# Patient Record
Sex: Male | Born: 1980
Health system: Southern US, Community
[De-identification: ages and names within clinical notes are randomized; demographics above are authoritative.]

## PROBLEM LIST (undated history)

## (undated) DIAGNOSIS — IMO0001 Reserved for inherently not codable concepts without codable children: Secondary | ICD-10-CM

## (undated) DIAGNOSIS — F17200 Nicotine dependence, unspecified, uncomplicated: Secondary | ICD-10-CM

## (undated) DIAGNOSIS — J45909 Unspecified asthma, uncomplicated: Secondary | ICD-10-CM

## (undated) HISTORY — PX: CYST REMOVAL NECK: SHX6281

---

## 2001-09-12 ENCOUNTER — Emergency Department (HOSPITAL_COMMUNITY): Admission: EM | Admit: 2001-09-12 | Discharge: 2001-09-12 | Payer: Self-pay | Admitting: Emergency Medicine

## 2001-09-24 ENCOUNTER — Emergency Department (HOSPITAL_COMMUNITY): Admission: EM | Admit: 2001-09-24 | Discharge: 2001-09-24 | Payer: Self-pay | Admitting: Emergency Medicine

## 2002-03-03 ENCOUNTER — Emergency Department (HOSPITAL_COMMUNITY): Admission: EM | Admit: 2002-03-03 | Discharge: 2002-03-03 | Payer: Self-pay | Admitting: Emergency Medicine

## 2004-12-28 ENCOUNTER — Emergency Department (HOSPITAL_COMMUNITY): Admission: EM | Admit: 2004-12-28 | Discharge: 2004-12-28 | Payer: Self-pay | Admitting: Emergency Medicine

## 2004-12-30 ENCOUNTER — Emergency Department (HOSPITAL_COMMUNITY): Admission: EM | Admit: 2004-12-30 | Discharge: 2004-12-30 | Payer: Self-pay | Admitting: *Deleted

## 2006-11-24 IMAGING — CR DG HAND COMPLETE 3+V*L*
3 series · 3 of 3 positions shown · non-contrast
Comparison: none

CLINICAL DATA: cut finger
LEFT HAND 3 VIEWS:
There is soft tissue swelling surrounding the proximal phalanx of the left fourth finger.  There are no fractures or foreign bodies and there are no destructive changes.

[view not recorded (1 of 3)]
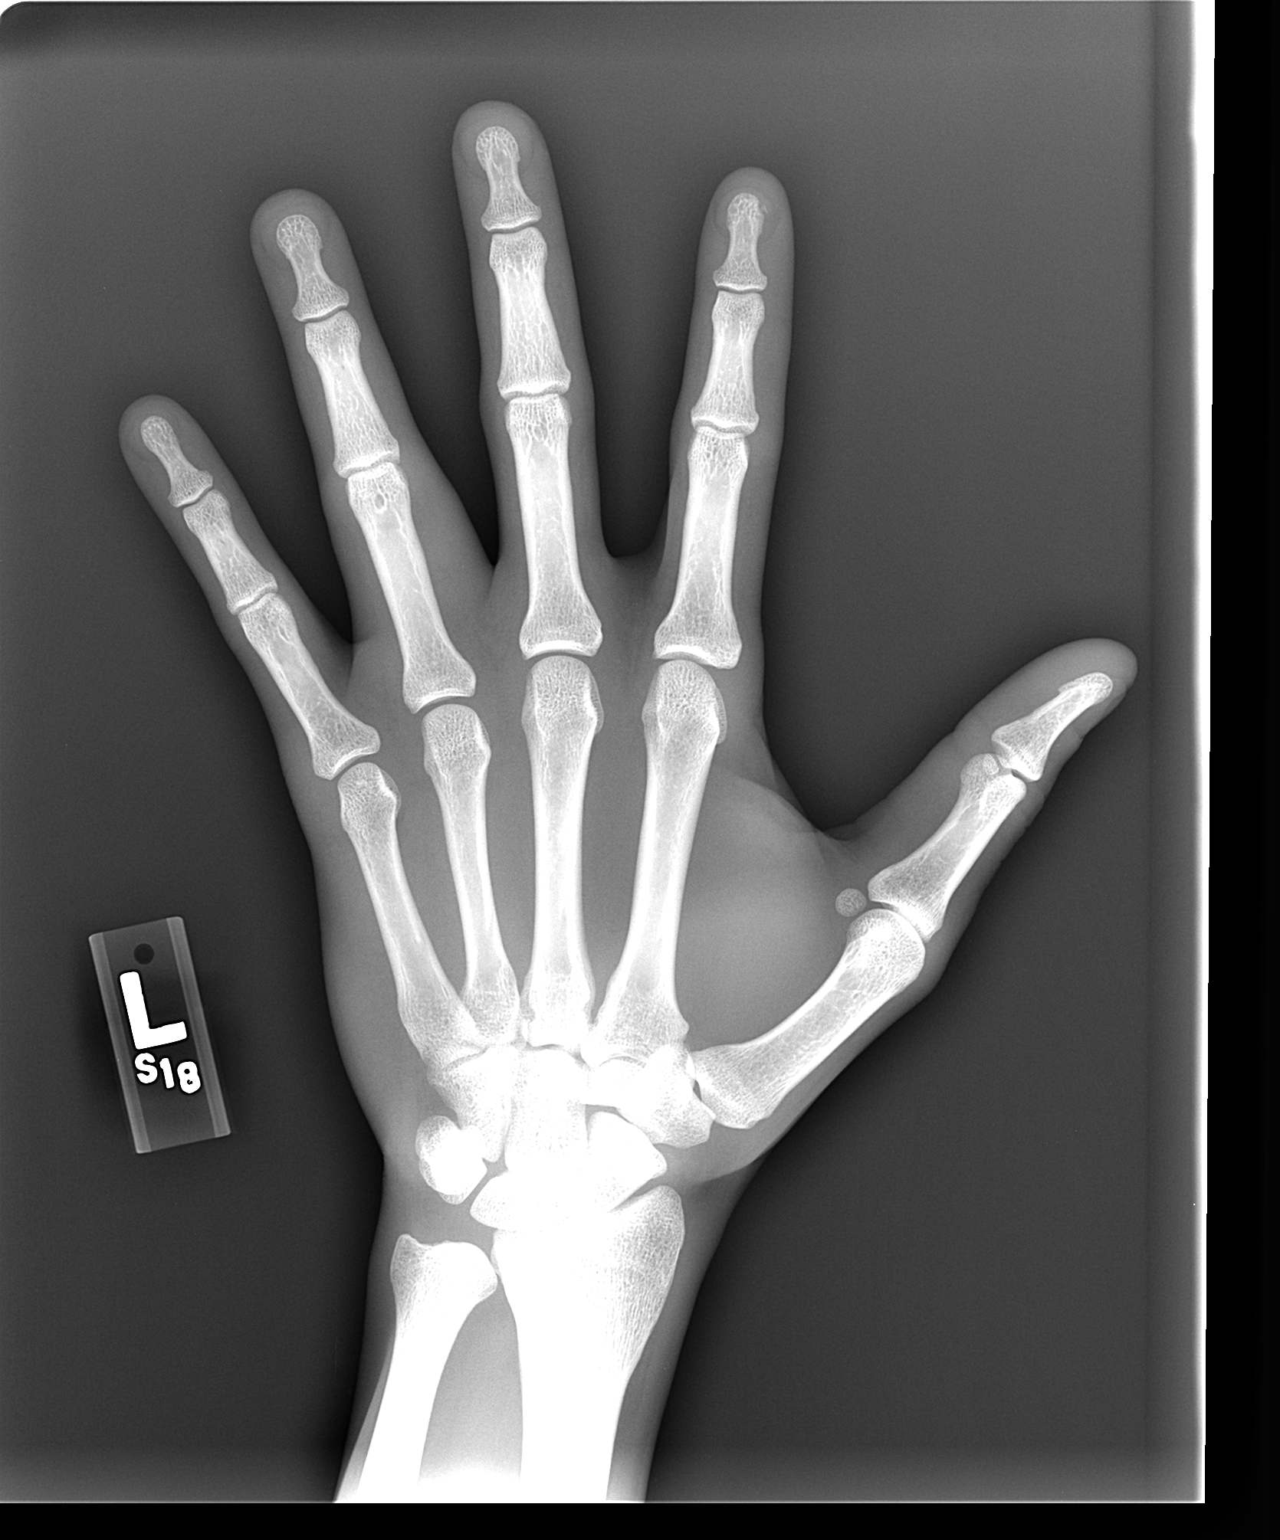

[view not recorded (2 of 3)]
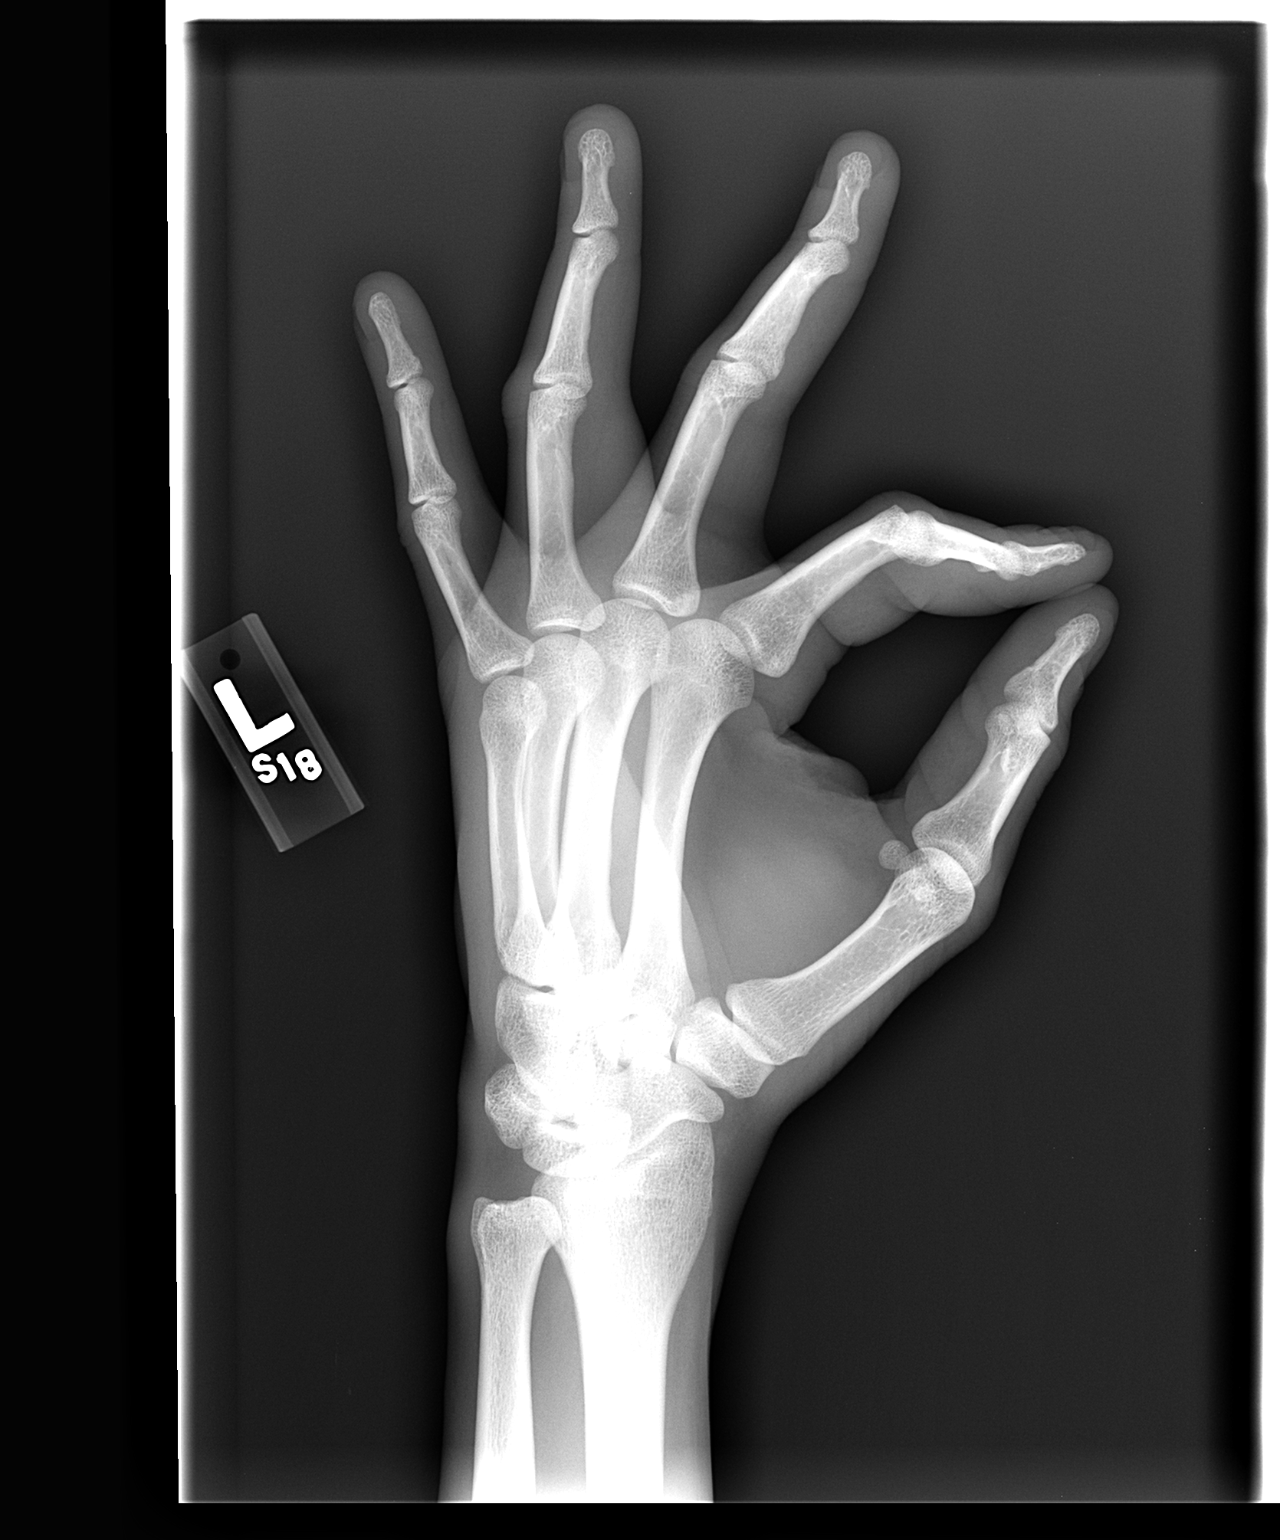

[view not recorded (3 of 3)]
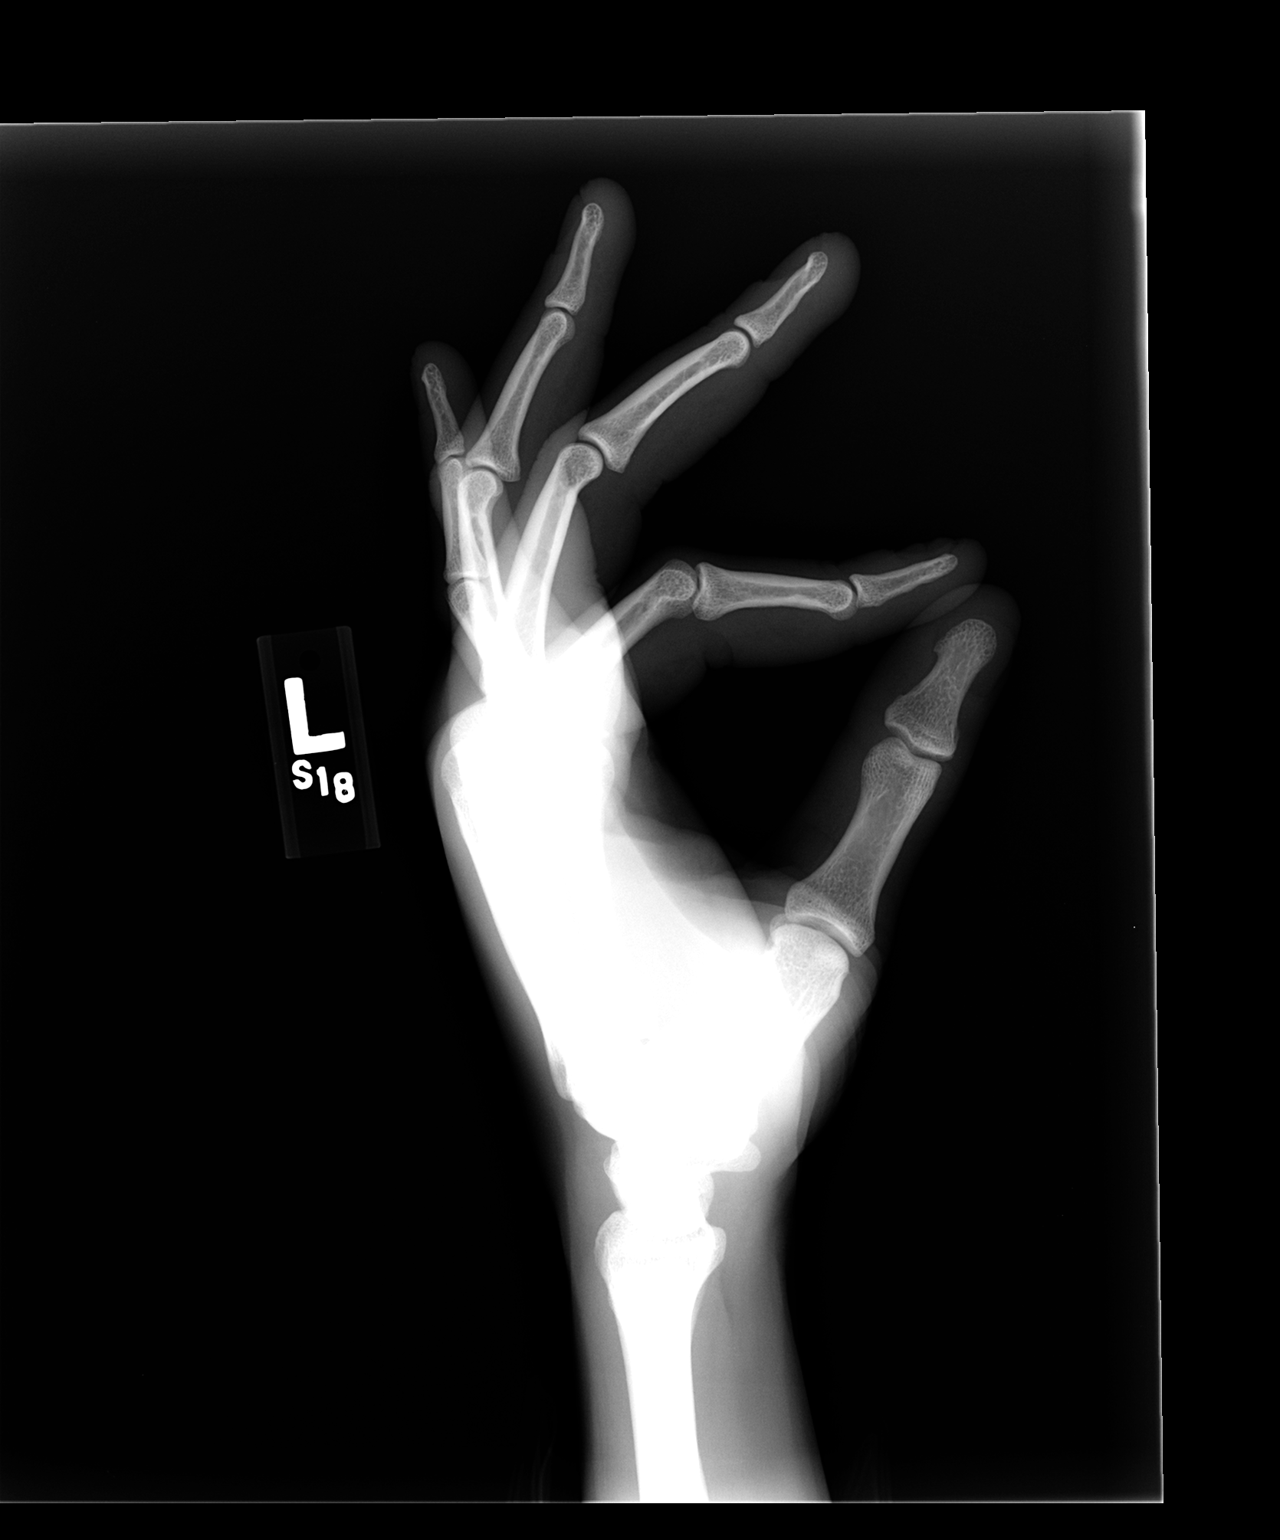

[3 of 3 positions shown; findings below may reference images not displayed]

IMPRESSION: Soft tissue swelling associated with the proximal phalanx of the left fourth finger.  Otherwise normal study.

## 2011-12-17 ENCOUNTER — Emergency Department (INDEPENDENT_AMBULATORY_CARE_PROVIDER_SITE_OTHER)
Admission: EM | Admit: 2011-12-17 | Discharge: 2011-12-17 | Disposition: A | Discharge status: Home / Self Care | Payer: Self-pay | Source: Home / Self Care | Attending: Family Medicine | Admitting: Family Medicine

## 2011-12-17 ENCOUNTER — Encounter (HOSPITAL_COMMUNITY): Payer: Self-pay | Admitting: Emergency Medicine

## 2011-12-17 DIAGNOSIS — J40 Bronchitis, not specified as acute or chronic: Secondary | ICD-10-CM

## 2011-12-17 MED ORDER — PREDNISONE 20 MG PO TABS: ORAL_TABLET | ORAL | Status: AC

## 2011-12-17 MED ORDER — CETIRIZINE-PSEUDOEPHEDRINE ER 5-120 MG PO TB12: 1.0000 | ORAL_TABLET | Freq: Two times a day (BID) | ORAL | Status: AC

## 2011-12-17 MED ORDER — ALBUTEROL SULFATE HFA 108 (90 BASE) MCG/ACT IN AERS: 1.0000 | INHALATION_SPRAY | Freq: Four times a day (QID) | RESPIRATORY_TRACT | Status: AC | PRN

## 2011-12-17 MED ORDER — DEXTROMETHORPHAN-GUAIFENESIN 15-400 MG PO TABS: 1.0000 | ORAL_TABLET | Freq: Three times a day (TID) | ORAL | Status: DC | PRN

## 2011-12-17 MED ORDER — AZITHROMYCIN 250 MG PO TABS: 250.0000 mg | ORAL_TABLET | Freq: Every day | ORAL | Status: AC

## 2011-12-17 MED ORDER — IBUPROFEN 600 MG PO TABS: 600.0000 mg | ORAL_TABLET | Freq: Three times a day (TID) | ORAL | Status: AC

## 2011-12-17 NOTE — ED Notes (Signed)
Cough for 2 weeks.  Intermittently has had green phlegm.  At one point patient thought symptoms were improving, but feels symptoms are worsening.  Denies fever.  Patient stresses the cough is terrible, coughing so bad at times, results in vomiting

## 2011-12-17 NOTE — ED Notes (Signed)
Instructed to remove shirts and place gown on for provider examinaltion

## 2011-12-17 NOTE — Discharge Instructions (Signed)
You need to quit smoking! Is very important top keep well hydrated. Take the prescribed medications as instructed. Take ibuprofen scheduled every 8 hours for the next 24-48 hours take with food and plenty of liquids as it can upset your stomach, can take over the counter prilosec while taking ibuprofen. Use nasal saline spray at least 3 times a day. (simply saline is over the counter) Return if difficulty breathing or not keeping fluids down. Despite following treatment

## 2011-12-19 NOTE — ED Provider Notes (Signed)
History     CSN: [id number]  Arrival date & time 12/17/11  1621   First MD Initiated Contact with Patient 12/17/11 1704      Chief Complaint  Patient presents with  . Cough    (Consider location/radiation/quality/duration/timing/severity/associated sxs/prior treatment) HPI Comments: 31 y/o smoker h/o seasonal allergies. Here c/o cough for 2 weeks. Cough is persistent and associated to wheezing has had pos tussive emesis and cough worse in last 2 days also associated with green sputum. Denies chest pain or shortness of breath denies fever. Reports decreased appetite and general malaise during las 2-3 days.   History reviewed. No pertinent past medical history.  History reviewed. No pertinent past surgical history.  No family history on file.  History  Substance Use Topics  . Smoking status: Current Everyday Smoker  . Smokeless tobacco: Not on file  . Alcohol Use: Yes      Review of Systems  Constitutional: Positive for appetite change. Negative for fever and chills.  HENT: Positive for congestion, rhinorrhea and sneezing. Negative for sore throat and neck pain.   Eyes: Negative for discharge.  Respiratory: Positive for cough and wheezing. Negative for chest tightness and shortness of breath.   Cardiovascular: Negative for chest pain, palpitations and leg swelling.  Gastrointestinal: Negative for nausea, vomiting, abdominal pain and diarrhea.  Skin: Negative for color change and rash.  Neurological: Negative for dizziness and headaches.    Allergies  Review of patient's allergies indicates no known allergies.  Home Medications   Current Outpatient Rx  Name Route Sig Dispense Refill  . OVER THE COUNTER MEDICATION  Cough syrup    . ALBUTEROL SULFATE HFA 108 (90 BASE) MCG/ACT IN AERS Inhalation Inhale 1-2 puffs into the lungs every 6 (six) hours as needed for wheezing or shortness of breath (or cough spells). 1 Inhaler 0  . AZITHROMYCIN 250 MG PO TABS Oral Take 1  tablet (250 mg total) by mouth daily. Take first 2 tablets together, then 1 every day until finished. 6 tablet 0  . CETIRIZINE-PSEUDOEPHEDRINE ER 5-120 MG PO TB12 Oral Take 1 tablet by mouth 2 (two) times daily. 30 tablet 0  . DEXTROMETHORPHAN-GUAIFENESIN 15-400 MG PO TABS Oral Take 1 tablet by mouth 3 (three) times daily as needed. 15 each 0  . IBUPROFEN 600 MG PO TABS Oral Take 1 tablet (600 mg total) by mouth 3 (three) times daily. 20 tablet 0  . PREDNISONE 20 MG PO TABS  2 tabs po daily for 5 days 10 tablet no    BP 132/80  Pulse 75  Temp(Src) 98.6 F (37 C) (Oral)  Resp 19  SpO2 97%  Physical Exam  Nursing note and vitals reviewed. Constitutional: He is oriented to person, place, and time. He appears well-developed and well-nourished. No distress.  HENT:  Head: Normocephalic and atraumatic.  Nose: Nose normal.  Mouth/Throat: Oropharynx is clear and moist. No oropharyngeal exudate.       Nasal Congestion with erythema and swelling of nasal turbinates, clear rhinorrhea. TM's normal  Eyes: Conjunctivae are normal. Right eye exhibits no discharge. Left eye exhibits no discharge. No scleral icterus.  Neck: Normal range of motion. Neck supple. No JVD present.  Cardiovascular: Normal rate, regular rhythm and normal heart sounds.   Pulmonary/Chest:       Decreased breath sound with scattered rhonchi bilaterally. No active wheezing no rales. No orthopnea or tachypnea.   Abdominal: Soft. There is no tenderness.  Lymphadenopathy:    He has no cervical adenopathy.  Neurological: He is alert and oriented to person, place, and time.  Skin: Skin is warm. No rash noted.    ED Course  Procedures (including critical care time)  Labs Reviewed - No data to display No results found.   1. Bronchitis       MDM  Encouraged to quit smoking although patient is not contemplative.Treated with prednisone, azithromycin and albuterol.         Sharin Grave, MD 12/20/11 0347

## 2013-04-13 ENCOUNTER — Encounter (HOSPITAL_COMMUNITY): Payer: Self-pay | Admitting: *Deleted

## 2013-04-13 ENCOUNTER — Emergency Department (HOSPITAL_COMMUNITY): Payer: Self-pay

## 2013-04-13 ENCOUNTER — Emergency Department (HOSPITAL_COMMUNITY)
Admission: EM | Admit: 2013-04-13 | Discharge: 2013-04-13 | Discharge status: Against Medical Advice | Payer: Self-pay | Attending: Emergency Medicine | Admitting: Emergency Medicine

## 2013-04-13 DIAGNOSIS — F17200 Nicotine dependence, unspecified, uncomplicated: Secondary | ICD-10-CM | POA: Insufficient documentation

## 2013-04-13 DIAGNOSIS — R220 Localized swelling, mass and lump, head: Secondary | ICD-10-CM | POA: Insufficient documentation

## 2013-04-13 DIAGNOSIS — J029 Acute pharyngitis, unspecified: Secondary | ICD-10-CM | POA: Insufficient documentation

## 2013-04-13 DIAGNOSIS — G8921 Chronic pain due to trauma: Secondary | ICD-10-CM | POA: Insufficient documentation

## 2013-04-13 NOTE — ED Notes (Signed)
Pt was getting ready to go for x-ray and when transport arrived he was seen walking out with his family.  Pt did not say anything to anyone and just left.  Waiting to see if he shows back up before discharged from sysyem

## 2013-04-13 NOTE — ED Provider Notes (Signed)
CSN: [id number]     Arrival date & time 04/13/13  1837 History  This chart was scribed for Trixie Dredge, PA working with Raelyn Number, DO by Quintella Reichert, ED Scribe. This patient was seen in room TR05C/TR05C and the patient's care was started at 7:50 PM.  Chief Complaint  Patient presents with  . Dental Pain    The history is provided by the patient. No language interpreter was used.    HPI Comments: Jack Baldwin is a 32 y.o. male who presents to the Emergency Department complaining of right lower facial swelling and dental pain that began 3 months ago but became more severe over the past several days.  Pt states that he has a molar in that area that broke last year.  3 months ago he developed aching pain in the area around that tooth that has been gradually-worsening since then.  He has also noticed some swelling to the gum near that tooth that began at approximately the same time as his pain and which has been on-and-off since then.  Pt went to the dentist one month ago and was advised that he had an abscess in his mouth.  He was given antibiotics and pain medication which he states did not provide relief.  He did not return to his dentist for further treatment because per patient "he wanted me to go to a doctor first."  Pt also notes he had a sore throat 2 days ago but none since then.  He denies fever, chills, difficulty swallowing, difficulty breathing, night sweats, unintentional weight loss, cough, vomiting, or diarrhea.  He is eating and drinking normally.     History reviewed. No pertinent past medical history.  History reviewed. No pertinent past surgical history.  No family history on file.  History  Substance Use Topics  . Smoking status: Current Every Day Smoker -- 0.50 packs/day    Types: Cigarettes  . Smokeless tobacco: Not on file  . Alcohol Use: Yes     Review of Systems  Constitutional: Negative for fever, chills, diaphoresis and unexpected weight change.  HENT:  Positive for sore throat and dental problem. Negative for trouble swallowing.   Respiratory: Negative for cough and shortness of breath.   Gastrointestinal: Negative for vomiting and diarrhea.     Allergies  Review of patient's allergies indicates no known allergies.  Home Medications   Current Outpatient Rx  Name  Route  Sig  Dispense  Refill  . EXPIRED: albuterol (PROVENTIL HFA;VENTOLIN HFA) 108 (90 BASE) MCG/ACT inhaler   Inhalation   Inhale 1-2 puffs into the lungs every 6 (six) hours as needed for wheezing or shortness of breath (or cough spells).   1 Inhaler   0    BP 144/90  Pulse 94  Temp(Src) 99.1 F (37.3 C) (Oral)  Resp 18  SpO2 100%  Physical Exam  Nursing note and vitals reviewed. Constitutional: He appears well-developed and well-nourished. No distress.  HENT:  Head: Normocephalic and atraumatic.  Pharynx erythematous with bilateral tonsillar swelling Right lower 2nd molar has old fracture, non-tender to percussion Non-tender bony enlargement over right mandibular.  Bony enlargement vs firm swelling  Neck: Neck supple.  Pulmonary/Chest: Effort normal.  Neurological: He is alert.  Skin: He is not diaphoretic.    ED Course  Procedures (including critical care time)  DIAGNOSTIC STUDIES: Oxygen Saturation is 100% on room air, normal by my interpretation.    COORDINATION OF CARE: 7:57 PM-Discussed treatment plan which includes imaging with pt  at bedside and pt agreed to plan.    Labs Review Labs Reviewed - No data to display  Imaging Review No results found.  MDM   1. Mass of mandible    Patient with right lower 2nd molar fracture and pain and adjacent firm nontender swelling.  This area of swelling has been present for several months.  Given the chronicity and lack of tenderness, I doubted acute dental abscess and was concerned about a bony mass.  Pt denied any trauma to the area.  I ordered an xray to evaluate for bony mass but patient eloped  without informing staff prior to being taken to xray.     I personally performed the services described in this documentation, which was scribed in my presence. The recorded information has been reviewed and is accurate.   [place], PA-C 04/13/13 [phone]

## 2013-04-13 NOTE — ED Notes (Signed)
Pt with R lower molar that broke last year.  3 months ago pt felt his R his R lower gum around the tooth began to swell.  Pt came today b/c pain is getting worse.

## 2013-04-14 NOTE — ED Provider Notes (Signed)
Medical screening examination/treatment/procedure(s) were performed by non-physician practitioner and as supervising physician I was immediately available for consultation/collaboration.  Layla Maw Dalon Reichart, DO 04/14/13 [phone]

## 2017-05-02 ENCOUNTER — Ambulatory Visit (HOSPITAL_COMMUNITY)
Admission: EM | Admit: 2017-05-02 | Discharge: 2017-05-02 | Disposition: A | Discharge status: Home / Self Care | Payer: Self-pay | Attending: Family Medicine | Admitting: Family Medicine

## 2017-05-02 ENCOUNTER — Encounter (HOSPITAL_COMMUNITY): Payer: Self-pay | Admitting: Emergency Medicine

## 2017-05-02 DIAGNOSIS — L0291 Cutaneous abscess, unspecified: Secondary | ICD-10-CM

## 2017-05-02 MED ORDER — SULFAMETHOXAZOLE-TRIMETHOPRIM 800-160 MG PO TABS: 1.0000 | ORAL_TABLET | Freq: Two times a day (BID) | ORAL | 0 refills | Status: DC

## 2017-05-02 NOTE — ED Triage Notes (Signed)
Noticed one week ago as possible insect bite.  3-4 days ago it started swelling.  Abscess is right jaw, near right ear

## 2017-05-02 NOTE — ED Provider Notes (Signed)
[facility]    CSN: [id number] Arrival date & time: 05/02/17  1311     History   Chief Complaint Chief Complaint  Patient presents with  . Abscess    HPI Jack Baldwin is a 36 y.o. male.   Patient is a 36 yo M who presents to urgent care with an abscess. States started off about 1 week ago as a small bump in front of his R ear. He does not recall any insect bite or puncture wound. States bump started getting progressively bigger and tender to touch with some warmth. Has been trying warm compresses with some relief, has noticed it has gotten smaller over the last day. No drainage. No fever/chills, n/v, hearing changes.      History reviewed. No pertinent past medical history.  There are no active problems to display for this patient.   History reviewed. No pertinent surgical history.     Home Medications    Prior to Admission medications   Medication Sig Start Date End Date Taking? Authorizing Provider  albuterol (PROVENTIL HFA;VENTOLIN HFA) 108 (90 BASE) MCG/ACT inhaler Inhale 1-2 puffs into the lungs every 6 (six) hours as needed for wheezing or shortness of breath (or cough spells). 12/17/11 12/16/12  Moreno-Coll, Adlih, MD    Family History No family history on file.  Social History Social History  Substance Use Topics  . Smoking status: Current Every Day Smoker    Packs/day: 0.50    Types: Cigarettes  . Smokeless tobacco: Not on file  . Alcohol use Yes     Allergies   Patient has no known allergies.   Review of Systems Review of Systems  Constitutional: Negative for chills and fever.  HENT: Positive for facial swelling (bump/abscess in front of R ear ). Negative for congestion, dental problem, ear discharge, ear pain, hearing loss, sore throat and voice change.   Eyes: Negative for visual disturbance.  Respiratory: Negative for shortness of breath.   Cardiovascular: Negative for chest pain.  Gastrointestinal: Negative for abdominal pain,  constipation, diarrhea, nausea and vomiting.  Genitourinary: Negative for difficulty urinating and dysuria.  Skin: Negative for rash and wound.  Neurological: Negative for headaches.     Physical Exam Triage Vital Signs ED Triage Vitals  Enc Vitals Group     BP 05/02/17 1421 128/85     Pulse Rate 05/02/17 1421 85     Resp 05/02/17 1421 18     Temp 05/02/17 1421 97.9 F (36.6 C)     Temp Source 05/02/17 1421 Oral     SpO2 05/02/17 1421 100 %     Weight --      Height --      Head Circumference --      Peak Flow --      Pain Score 05/02/17 1420 2     Pain Loc --      Pain Edu? --      Excl. in GC? --    No data found.   Updated Vital Signs BP 128/85 (BP Location: Left Arm)   Pulse 85   Temp 97.9 F (36.6 C) (Oral)   Resp 18   SpO2 100%   Visual Acuity Right Eye Distance:   Left Eye Distance:   Bilateral Distance:    Right Eye Near:   Left Eye Near:    Bilateral Near:     Physical Exam  Constitutional: He is oriented to person, place, and time. He appears well-developed and well-nourished. No distress.  HENT:  Head: Normocephalic and atraumatic.  Right Ear: External ear normal.  Left Ear: External ear normal.  Nose: Nose normal.  Mouth/Throat: Oropharynx is clear and moist. No oropharyngeal exudate.  Eyes: Conjunctivae and EOM are normal.  Neck: Normal range of motion. Neck supple. No tracheal deviation present.  Cardiovascular: Normal rate, regular rhythm and intact distal pulses.  Exam reveals no gallop and no friction rub.   Murmur (soft systolic murmur) heard. Pulmonary/Chest: Effort normal and breath sounds normal.  Abdominal: Soft. Bowel sounds are normal. He exhibits no distension. There is no tenderness. There is no rebound and no guarding.  Musculoskeletal: Normal range of motion.  Lymphadenopathy:    He has no cervical adenopathy.  Neurological: He is alert and oriented to person, place, and time.  Skin: Skin is warm and dry. Capillary refill  takes less than 2 seconds.  2cm abscess in front of R ear with central area of fluctuance. No tragus or mastoid tenderness. Please see image below  Psychiatric: He has a normal mood and affect.         UC Treatments / Results  Labs (all labs ordered are listed, but only abnormal results are displayed) Labs Reviewed - No data to display  EKG  EKG Interpretation None       Radiology No results found.  Procedures .Marland KitchenIncision and Drainage Date/Time: 05/02/2017 3:18 PM Performed by: Leland Her Authorized by: Mardella Layman   Consent:    Consent obtained:  Verbal   Consent given by:  Patient and spouse   Risks discussed:  Bleeding, incomplete drainage, infection and pain   Alternatives discussed:  No treatment Location:    Type:  Abscess   Size:  2cm   Location:  Head   Head/neck location: R side of face anterior to external ear. Pre-procedure details:    Skin preparation:  Betadine Anesthesia (see MAR for exact dosages):    Anesthesia method:  Local infiltration   Local anesthetic:  Lidocaine 1% WITH epi Procedure type:    Complexity:  Simple Procedure details:    Needle aspiration: no     Incision types:  Stab incision   Scalpel blade:  10   Wound management:  Probed and deloculated   Drainage:  Purulent   Drainage amount:  Moderate   Wound treatment:  Wound left open   Packing materials:  None Post-procedure details:    Patient tolerance of procedure:  Tolerated well, no immediate complications   (including critical care time)  Medications Ordered in UC Medications - No data to display   Initial Impression / Assessment and Plan / UC Course  I have reviewed the triage vital signs and the nursing notes.  Pertinent labs & imaging results that were available during my care of the patient were reviewed by me and considered in my medical decision making (see chart for details).   Patient is a 36 yo M who presents to urgent care with an abscess on the R  side of face. He has had no systemic symptoms and is afebrile. Abscess was incised and drained without complications.  Instructed to start bactrium for the next  7 days to treat the surrounding cellulitis. Recommended OTC tylenol and ibuprofen for pain. Discussed return precautions with patient and wife.   Final Clinical Impressions(s) / UC Diagnoses   Final diagnoses:  Abscess    New Prescriptions Discharge Medication List as of 05/02/2017  3:06 PM    START taking these medications   Details  sulfamethoxazole-trimethoprim (BACTRIM DS,SEPTRA DS) 800-160 MG tablet Take 1 tablet by mouth 2 (two) times daily., Starting Mon 05/02/2017, Normal          Jeneen Rinks J, DO 05/02/17 1534

## 2017-05-02 NOTE — Discharge Instructions (Addendum)
Take antibiotic twice a day for 7 days. You can take over the counter tylenol or ibuprofen for pain

## 2017-05-02 NOTE — ED Notes (Signed)
Dressing applied to I&D, supplies given for home dressing changes

## 2018-06-19 ENCOUNTER — Encounter (HOSPITAL_COMMUNITY): Payer: Self-pay | Admitting: Internal Medicine

## 2018-06-19 ENCOUNTER — Other Ambulatory Visit: Payer: Self-pay

## 2018-06-19 ENCOUNTER — Emergency Department (HOSPITAL_COMMUNITY): Payer: Self-pay

## 2018-06-19 ENCOUNTER — Inpatient Hospital Stay (HOSPITAL_COMMUNITY)
Admission: EM | Admit: 2018-06-19 | Discharge: 2018-06-23 | DRG: 871 | Disposition: A | Discharge status: Home / Self Care | Payer: Self-pay | Attending: Internal Medicine | Admitting: Internal Medicine

## 2018-06-19 DIAGNOSIS — Z23 Encounter for immunization: Secondary | ICD-10-CM

## 2018-06-19 DIAGNOSIS — E876 Hypokalemia: Secondary | ICD-10-CM | POA: Diagnosis present

## 2018-06-19 DIAGNOSIS — Q231 Congenital insufficiency of aortic valve: Secondary | ICD-10-CM

## 2018-06-19 DIAGNOSIS — Z720 Tobacco use: Secondary | ICD-10-CM | POA: Diagnosis present

## 2018-06-19 DIAGNOSIS — J9601 Acute respiratory failure with hypoxia: Secondary | ICD-10-CM | POA: Diagnosis present

## 2018-06-19 DIAGNOSIS — R6510 Systemic inflammatory response syndrome (SIRS) of non-infectious origin without acute organ dysfunction: Secondary | ICD-10-CM

## 2018-06-19 DIAGNOSIS — I350 Nonrheumatic aortic (valve) stenosis: Secondary | ICD-10-CM | POA: Diagnosis present

## 2018-06-19 DIAGNOSIS — I359 Nonrheumatic aortic valve disorder, unspecified: Secondary | ICD-10-CM

## 2018-06-19 DIAGNOSIS — A419 Sepsis, unspecified organism: Principal | ICD-10-CM | POA: Diagnosis present

## 2018-06-19 DIAGNOSIS — J441 Chronic obstructive pulmonary disease with (acute) exacerbation: Secondary | ICD-10-CM | POA: Diagnosis present

## 2018-06-19 DIAGNOSIS — J9600 Acute respiratory failure, unspecified whether with hypoxia or hypercapnia: Secondary | ICD-10-CM

## 2018-06-19 DIAGNOSIS — E872 Acidosis, unspecified: Secondary | ICD-10-CM | POA: Diagnosis present

## 2018-06-19 DIAGNOSIS — F17210 Nicotine dependence, cigarettes, uncomplicated: Secondary | ICD-10-CM | POA: Diagnosis present

## 2018-06-19 DIAGNOSIS — F17200 Nicotine dependence, unspecified, uncomplicated: Secondary | ICD-10-CM

## 2018-06-19 DIAGNOSIS — J020 Streptococcal pharyngitis: Secondary | ICD-10-CM | POA: Diagnosis present

## 2018-06-19 HISTORY — DX: Nicotine dependence, unspecified, uncomplicated: F17.200

## 2018-06-19 HISTORY — DX: Reserved for inherently not codable concepts without codable children: IMO0001

## 2018-06-19 LAB — CBC WITH DIFFERENTIAL/PLATELET
ABS IMMATURE GRANULOCYTES: 0.06 10*3/uL (ref 0.00–0.07)
BASOS ABS: 0 10*3/uL (ref 0.0–0.1)
BASOS PCT: 0 %
Eosinophils Absolute: 0 10*3/uL (ref 0.0–0.5)
Eosinophils Relative: 0 %
HCT: 49.1 % (ref 39.0–52.0)
Hemoglobin: 14.9 g/dL (ref 13.0–17.0)
Immature Granulocytes: 0 %
Lymphocytes Relative: 18 %
Lymphs Abs: 2.9 10*3/uL (ref 0.7–4.0)
MCH: 27.6 pg (ref 26.0–34.0)
MCHC: 30.3 g/dL (ref 30.0–36.0)
MCV: 91.1 fL (ref 80.0–100.0)
Monocytes Absolute: 1.3 10*3/uL — ABNORMAL HIGH (ref 0.1–1.0)
Monocytes Relative: 8 %
NEUTROS ABS: 11.6 10*3/uL — AB (ref 1.7–7.7)
NRBC: 0 % (ref 0.0–0.2)
Neutrophils Relative %: 74 %
PLATELETS: 298 10*3/uL (ref 150–400)
RBC: 5.39 MIL/uL (ref 4.22–5.81)
RDW: 13.6 % (ref 11.5–15.5)
WBC: 15.9 10*3/uL — AB (ref 4.0–10.5)

## 2018-06-19 LAB — COMPREHENSIVE METABOLIC PANEL
ALBUMIN: 4.6 g/dL (ref 3.5–5.0)
ALT: 12 U/L (ref 0–44)
ANION GAP: 16 — AB (ref 5–15)
AST: 24 U/L (ref 15–41)
Alkaline Phosphatase: 59 U/L (ref 38–126)
BUN: 5 mg/dL — ABNORMAL LOW (ref 6–20)
CHLORIDE: 106 mmol/L (ref 98–111)
CO2: 18 mmol/L — AB (ref 22–32)
Calcium: 9.1 mg/dL (ref 8.9–10.3)
Creatinine, Ser: 0.97 mg/dL (ref 0.61–1.24)
GFR calc Af Amer: >60 mL/min (ref >60)
GFR calc non Af Amer: >60 mL/min (ref >60)
GLUCOSE: 184 mg/dL — AB (ref 70–99)
POTASSIUM: 3.2 mmol/L — AB (ref 3.5–5.1)
SODIUM: 140 mmol/L (ref 135–145)
Total Bilirubin: 0.7 mg/dL (ref 0.3–1.2)
Total Protein: 7.4 g/dL (ref 6.5–8.1)

## 2018-06-19 LAB — I-STAT VENOUS BLOOD GAS, ED
Acid-base deficit: 2 mmol/L (ref 0.0–2.0)
Bicarbonate: 27.4 mmol/L (ref 20.0–28.0)
O2 Saturation: 37 %
PCO2 VEN: 64.9 mmHg — AB (ref 44.0–60.0)
PH VEN: 7.233 — AB (ref 7.250–7.430)
Patient temperature: 37
TCO2: 29 mmol/L (ref 22–32)
pO2, Ven: 27 mmHg — CL (ref 32.0–45.0)

## 2018-06-19 LAB — BRAIN NATRIURETIC PEPTIDE: B Natriuretic Peptide: 95.7 pg/mL (ref 0.0–100.0)

## 2018-06-19 LAB — TROPONIN I: Troponin I: <0.03 ng/mL (ref <0.03)

## 2018-06-19 LAB — PROCALCITONIN: PROCALCITONIN: 0.26 ng/mL

## 2018-06-19 LAB — GROUP A STREP BY PCR: Group A Strep by PCR: DETECTED — AB

## 2018-06-19 LAB — LACTIC ACID, PLASMA: LACTIC ACID, VENOUS: 4.2 mmol/L — AB (ref 0.5–1.9)

## 2018-06-19 MED ORDER — LEVALBUTEROL HCL 1.25 MG/0.5ML IN NEBU
1.2500 mg | INHALATION_SOLUTION | Freq: Four times a day (QID) | RESPIRATORY_TRACT | Status: DC
  Administered 2018-06-19 – 2018-06-20 (×5): 1.25 mg via RESPIRATORY_TRACT
  Filled 2018-06-19 (×5): qty 0.5

## 2018-06-19 MED ORDER — ALBUTEROL SULFATE (2.5 MG/3ML) 0.083% IN NEBU
INHALATION_SOLUTION | RESPIRATORY_TRACT | Status: AC
  Administered 2018-06-19: 10 mg
  Filled 2018-06-19: qty 12

## 2018-06-19 MED ORDER — DOXYCYCLINE HYCLATE 100 MG PO TABS
100.0000 mg | ORAL_TABLET | Freq: Two times a day (BID) | ORAL | Status: DC
  Administered 2018-06-19: 100 mg via ORAL
  Filled 2018-06-19: qty 1

## 2018-06-19 MED ORDER — SODIUM CHLORIDE 0.9 % IV BOLUS
500.0000 mL | Freq: Once | INTRAVENOUS | Status: AC
  Administered 2018-06-19: 500 mL via INTRAVENOUS

## 2018-06-19 MED ORDER — DM-GUAIFENESIN ER 30-600 MG PO TB12
1.0000 | ORAL_TABLET | Freq: Two times a day (BID) | ORAL | Status: DC | PRN
  Administered 2018-06-19: 1 via ORAL
  Filled 2018-06-19: qty 1

## 2018-06-19 MED ORDER — NICOTINE 21 MG/24HR TD PT24
21.0000 mg | MEDICATED_PATCH | Freq: Every day | TRANSDERMAL | Status: DC
  Administered 2018-06-19 – 2018-06-22 (×5): 21 mg via TRANSDERMAL
  Filled 2018-06-19 (×6): qty 1

## 2018-06-19 MED ORDER — ZOLPIDEM TARTRATE 5 MG PO TABS
5.0000 mg | ORAL_TABLET | Freq: Every evening | ORAL | Status: DC | PRN
  Administered 2018-06-19: 5 mg via ORAL
  Filled 2018-06-19: qty 1

## 2018-06-19 MED ORDER — SODIUM CHLORIDE 0.9 % IV SOLN
INTRAVENOUS | Status: DC
  Administered 2018-06-19: via INTRAVENOUS

## 2018-06-19 MED ORDER — ENOXAPARIN SODIUM 40 MG/0.4ML ~~LOC~~ SOLN
40.0000 mg | SUBCUTANEOUS | Status: DC
  Administered 2018-06-21 – 2018-06-22 (×2): 40 mg via SUBCUTANEOUS
  Filled 2018-06-19 (×4): qty 0.4

## 2018-06-19 MED ORDER — POTASSIUM CHLORIDE 20 MEQ/15ML (10%) PO SOLN
40.0000 meq | Freq: Once | ORAL | Status: AC
  Administered 2018-06-19: 40 meq via ORAL
  Filled 2018-06-19: qty 30

## 2018-06-19 MED ORDER — IPRATROPIUM BROMIDE 0.02 % IN SOLN
0.5000 mg | RESPIRATORY_TRACT | Status: DC
  Administered 2018-06-19 – 2018-06-20 (×7): 0.5 mg via RESPIRATORY_TRACT
  Filled 2018-06-19 (×7): qty 2.5

## 2018-06-19 MED ORDER — ONDANSETRON HCL 4 MG/2ML IJ SOLN: 4.0000 mg | Freq: Three times a day (TID) | INTRAMUSCULAR | Status: DC | PRN

## 2018-06-19 MED ORDER — METHYLPREDNISOLONE SODIUM SUCC 125 MG IJ SOLR
60.0000 mg | Freq: Three times a day (TID) | INTRAMUSCULAR | Status: DC
  Administered 2018-06-19 – 2018-06-22 (×10): 60 mg via INTRAVENOUS
  Filled 2018-06-19 (×11): qty 2

## 2018-06-19 MED ORDER — KETAMINE HCL 50 MG/5ML IJ SOSY
0.3000 mg/kg | PREFILLED_SYRINGE | Freq: Once | INTRAMUSCULAR | Status: AC
  Administered 2018-06-19: 24 mg via INTRAVENOUS
  Filled 2018-06-19: qty 5

## 2018-06-19 MED ORDER — ACETAMINOPHEN 325 MG PO TABS
650.0000 mg | ORAL_TABLET | Freq: Four times a day (QID) | ORAL | Status: DC | PRN
  Administered 2018-06-19: 650 mg via ORAL
  Filled 2018-06-19: qty 2

## 2018-06-19 MED ORDER — SODIUM CHLORIDE 0.9 % IV BOLUS
1500.0000 mL | Freq: Once | INTRAVENOUS | Status: AC
  Administered 2018-06-19: 1500 mL via INTRAVENOUS

## 2018-06-19 NOTE — H&P (Addendum)
History and Physical    Jack Baldwin ZOX:[id number] DOB: 03-26-1981 DOA: 06/19/2018  Referring MD/NP/PA:   PCP: No primary care provider on file.   Patient coming from:  The patient is coming from home.  At baseline, pt is independent for most of ADL.        Chief Complaint: Shortness of breath  HPI: Jack FullingBryant Baldwin is a 37 y.o. male with medical history significant of tobacco abuse, who presents with shortness of breath.  Patient states that he started having shortness of breath today, which has been progressively and rapidly worsening.  He has a dry cough, but had no chest pain, fever or chills.  He also reports sore throat.  Per ED physician, patient was initialy in severe respiratory distress, very diaphoretic, could not speak in full sentences, breathing is significantly labored. Patient was given Solu-Medrol, 2 duo nebs, 2 intramuscular epinephrines, 2 g of magnesium, and supported with assist ventilations by EMS in route. Pt was given one dose of Ketamine and started BIPAP in ED. His respiratory distress has improved partially when I saw pt in ED, but still needs BiPAP.  Patient does not have nausea vomiting, diarrhea or abdominal pain pain no symptoms of UTI or unilateral weakness.  Patient has been smoking heavily since age of 37, at least one pack a day.   ED Course: pt was found to have WBC 15.9, negative troponin, BNP 95.7, potassium 3.2 creatinine normal, temperature 97, tachycardia, tachypnea, oxygen saturation 100% on BiPAP.  Chest x-ray showed emphysema without infiltration.  Patient is placed on stepdown bed for observation.  Review of Systems:   General: no fevers, chills, no body weight gain, has fatigue. HEENT: no blurry vision, hearing changes, has sore throat Respiratory: has dyspnea, coughing, wheezing CV: no chest pain, no palpitations GI: no nausea, vomiting, abdominal pain, diarrhea, constipation GU: no dysuria, burning on urination, increased urinary frequency,  hematuria  Ext: no leg edema Neuro: no unilateral weakness, numbness, or tingling, no vision change or hearing loss Skin: no rash, no skin tear. MSK: No muscle spasm, no deformity, no limitation of range of movement in spin Heme: No easy bruising.  Travel history: No recent long distant travel.  Allergy: No Known Allergies  Past Medical History:  Diagnosis Date  . Smoking     Past Surgical History:  Procedure Laterality Date  . CYST REMOVAL NECK     cyst removal in throat per his sister    Social History:  reports that he has been smoking. He has never used smokeless tobacco. He reports that he drinks alcohol. He reports that he does not use drugs.  Family History:  Family History  Problem Relation Age of Onset  . Hypertension Mother   . Diabetes Mellitus II Father      Prior to Admission medications   Not on File    Physical Exam: Vitals:   06/19/18 1915 06/19/18 1930 06/19/18 1945 06/19/18 2045  BP: 109/74 102/69 108/74 109/82  Pulse: 86 (!) 102 (!) 104 98  Resp: (!) 23 (!) 22 17 20   Temp:      TempSrc:      SpO2: 98% 100% 100% 100%  Weight:       General: in acute respiratory distress HEENT:       Eyes: PERRL, EOMI, no scleral icterus.       ENT: No discharge from the ears and nose, has pharynx injection, no tonsillar enlargement.        Neck: No JVD,  no bruit, no mass felt. Heme: No neck lymph node enlargement. Cardiac: S1/S2, RRR, No murmurs, No gallops or rubs. Respiratory: Severely decreased air movement bilaterally, with minimal wheezing bilaterally GI: Soft, nondistended, nontender, no rebound pain, no organomegaly, BS present. GU: No hematuria Ext: No pitting leg edema bilaterally. 2+DP/PT pulse bilaterally. Musculoskeletal: No joint deformities, No joint redness or warmth, no limitation of ROM in spin. Skin: No rashes.  Neuro: Alert, oriented X3, cranial nerves II-XII grossly intact, moves all extremities normally. Psych: Patient is not psychotic,  no suicidal or hemocidal ideation.  Labs on Admission: I have personally reviewed following labs and imaging studies  CBC: Recent Labs  Lab 06/19/18 1843  WBC 15.9*  NEUTROABS 11.6*  HGB 14.9  HCT 49.1  MCV 91.1  PLT 298   Basic Metabolic Panel: Recent Labs  Lab 06/19/18 1843  NA 140  K 3.2*  CL 106  CO2 18*  GLUCOSE 184*  BUN 5*  CREATININE 0.97  CALCIUM 9.1   GFR: CrCl cannot be calculated (Unknown ideal weight.). Liver Function Tests: Recent Labs  Lab 06/19/18 1843  AST 24  ALT 12  ALKPHOS 59  BILITOT 0.7  PROT 7.4  ALBUMIN 4.6   No results for input(s): LIPASE, AMYLASE in the last 168 hours. No results for input(s): AMMONIA in the last 168 hours. Coagulation Profile: No results for input(s): INR, PROTIME in the last 168 hours. Cardiac Enzymes: Recent Labs  Lab 06/19/18 1843  TROPONINI <0.03   BNP (last 3 results) No results for input(s): PROBNP in the last 8760 hours. HbA1C: No results for input(s): HGBA1C in the last 72 hours. CBG: No results for input(s): GLUCAP in the last 168 hours. Lipid Profile: No results for input(s): CHOL, HDL, LDLCALC, TRIG, CHOLHDL, LDLDIRECT in the last 72 hours. Thyroid Function Tests: No results for input(s): TSH, T4TOTAL, FREET4, T3FREE, THYROIDAB in the last 72 hours. Anemia Panel: No results for input(s): VITAMINB12, FOLATE, FERRITIN, TIBC, IRON, RETICCTPCT in the last 72 hours. Urine analysis: No results found for: COLORURINE, APPEARANCEUR, LABSPEC, PHURINE, GLUCOSEU, HGBUR, BILIRUBINUR, KETONESUR, PROTEINUR, UROBILINOGEN, NITRITE, LEUKOCYTESUR Sepsis Labs: @LABRCNTIP (procalcitonin:4,lacticidven:4) )No results found for this or any previous visit (from the past 240 hour(s)).   Radiological Exams on Admission: Dg Chest Portable 1 View  Result Date: 06/19/2018 CLINICAL DATA:  Initial evaluation for acute shortness of breath. EXAM: PORTABLE CHEST 1 VIEW COMPARISON:  None. FINDINGS: Cardiac and mediastinal  silhouettes are within normal limits. Lungs mildly hypoinflated. Attenuation of the pulmonary markings suggestive of underlying emphysema. No focal infiltrates. No edema or effusion. No pneumothorax. No acute osseous abnormality. IMPRESSION: Emphysema.  No other active cardiopulmonary disease. Electronically Signed   By: Rise Mu M.D.   On: 06/19/2018 19:13     EKG: Independently reviewed.  Sinus rhythm, QTC 459, RAE, nonspecific T wave change.   Assessment/Plan Principal Problem:   Acute respiratory failure with hypoxia (HCC) Active Problems:   Sepsis (HCC)   Hypokalemia   Tobacco abuse  Acute respiratory failure with hypoxia: Etiology is not clear.  Patient is heavy smoker since age of 65.  Chest x-ray showed emphysema, indicating possible undiagnosed COPD.  Likely has COPD exacerbation today.  Patient also reports sore throat which is likely due to viral infection.   -will place on SDU for obs -Nebulizers: scheduled Atrovent and prn Xopenex Nebs -Solu-Medrol 60 mg IV tid -Start doxycycline-->changed to IV Rocephin -Mucinex for cough  -Incentive spirometry -Follow up blood culture x2, sputum culture, respiratory virus panel -  continue BiPAP now -Nasal cannula oxygen as needed to maintain O2 saturation 92% or greater as needed -pt will need PFT at outpt -f/u rapid strept screen   Sepsis due to possible COPD exacerbation and strep throat: Patient meets criteria for sepsis with leukocytosis, tachycardia and tachypnea.  Currently hemodynamically stable -On doxycycline as above -will get Procalcitonin and trend lactic acid levels per sepsis protocol. -IVF: 2.5L of NS bolus in ED, followed by 125 cc/h   Addendum: Rapid strept is positive. Lactic acid is 4.0 -will change doxycycline to IV Rocephin -IV fluid: will give total of 4 L normal saline bolus, then followed by 150 cc/h-->will give more bolus as needed.    Hypokalemia: K=3.2  on admission. - Repleted - Check  Mg level  Tobacco abuse -Did counseling about importance of quitting smoking -Nicotine patch   DVT ppx:  SQ Lovenox Code Status: Full code Family Communication:   Yes, patient's parents and sister  at bed side Disposition Plan:  Anticipate discharge back to previous home environment Consults called:  none Admission status:    SDU/obs     Date of Service 06/19/2018    Lorretta Harp [facility] Pager [phone]  If 7PM-7AM, please contact night-coverage [email or web address] Password [facility] 06/19/2018, 8:56 PM

## 2018-06-19 NOTE — ED Triage Notes (Signed)
Pt arrived [facility] from home for c/o SOB and sore throat. Pt diaphoretic and extremely labored breathing. Pt on CPAP on arrival. Given 2mg  Mag, 125 solumedrol, 2 duo nebs, 2 Epi (last at 1805)  O2 100% on CPAP Pulse 100

## 2018-06-19 NOTE — ED Notes (Signed)
Per Dr. Clyde LundborgNiu, trial pt off bipap

## 2018-06-19 NOTE — ED Provider Notes (Signed)
Emergency Department Provider Note   I have reviewed the triage vital signs and the nursing notes.   HISTORY  Chief Complaint Respiratory Distress   HPI Jack Baldwin is a 37 y.o. male with PMH of asthma presents to the emergency department by EMS in acute respiratory distress.  EMS states they were initially called out for shortness of breath and sore throat.  They found the patient to be diaphoretic with significant labored breathing.  He deteriorated abruptly and transferred to the EMS rig.  Patient was given Solu-Medrol, 2 duo nebs, 2 intramuscular epinephrines, 2 g of magnesium, and supported with assist ventilations in route.  Patient was denying any chest pain.  He is unable to give other significant history due to his acute respiratory status.   Level 5 caveat: Acute respiratory distress.   Past Medical History:  Diagnosis Date  . Smoking     Patient Active Problem List   Diagnosis Date Noted  . Sepsis (HCC) 06/19/2018  . Hypokalemia 06/19/2018  . Acute respiratory failure with hypoxia (HCC) 06/19/2018  . Tobacco abuse     Allergies Patient has no known allergies.  Family History  Problem Relation Age of Onset  . Hypertension Mother   . Diabetes Mellitus II Father     Social History Social History   Tobacco Use  . Smoking status: Current Every Day Smoker  . Smokeless tobacco: Never Used  Substance Use Topics  . Alcohol use: Yes  . Drug use: Never    Review of Systems  Level 5 caveat: Acute respiratory distress with assist ventilations.   ____________________________________________   PHYSICAL EXAM:  VITAL SIGNS: ED Triage Vitals  Enc Vitals Group     BP 06/19/18 1847 118/86     Pulse Rate 06/19/18 1847 99     Resp 06/19/18 1847 (!) 21     Temp 06/19/18 1847 (!) 97.2 F (36.2 C)     Temp Source 06/19/18 1847 Axillary     SpO2 06/19/18 1847 100 %     Weight 06/19/18 1800 180 lb (81.6 kg)     Pain Score 06/19/18 1847 0   Constitutional:  Alert, diaphoretic, and in acute distress.  Eyes: Conjunctivae are normal. Head: Atraumatic. Nose: No congestion/rhinnorhea. Mouth/Throat: Mucous membranes are moist. No PTA, tongue swelling, or obvious oral swelling.  Neck: No stridor.   Cardiovascular: Tachycardia. Good peripheral circulation. Grossly normal heart sounds.   Respiratory: Significant respiratory effort.  No retractions. Lungs diminished bilaterally with course wheezing bilaterally.  Gastrointestinal: No distention.  Musculoskeletal: No lower extremity tenderness nor edema. No gross deformities of extremities. Neurologic:  Normal speech and language. No gross focal neurologic deficits are appreciated.  Skin:  Skin is warm, dry and intact. No rash noted.   ____________________________________________   LABS (all labs ordered are listed, but only abnormal results are displayed)  Labs Reviewed  GROUP A STREP BY PCR - Abnormal; Notable for the following components:      Result Value   Group A Strep by PCR DETECTED (*)    All other components within normal limits  COMPREHENSIVE METABOLIC PANEL - Abnormal; Notable for the following components:   Potassium 3.2 (*)    CO2 18 (*)    Glucose, Bld 184 (*)    BUN 5 (*)    Anion gap 16 (*)    All other components within normal limits  CBC WITH DIFFERENTIAL/PLATELET - Abnormal; Notable for the following components:   WBC 15.9 (*)    Neutro  Abs 11.6 (*)    Monocytes Absolute 1.3 (*)    All other components within normal limits  LACTIC ACID, PLASMA - Abnormal; Notable for the following components:   Lactic Acid, Venous 4.2 (*)    All other components within normal limits  I-STAT VENOUS BLOOD GAS, ED - Abnormal; Notable for the following components:   pH, Ven 7.233 (*)    pCO2, Ven 64.9 (*)    pO2, Ven 27.0 (*)    All other components within normal limits  RESPIRATORY PANEL BY PCR  CULTURE, BLOOD (ROUTINE X 2)  CULTURE, BLOOD (ROUTINE X 2)  EXPECTORATED SPUTUM ASSESSMENT  W REFEX TO RESP CULTURE  BRAIN NATRIURETIC PEPTIDE  TROPONIN I  PROCALCITONIN  LACTIC ACID, PLASMA  HIV ANTIBODY (ROUTINE TESTING W REFLEX)  MAGNESIUM  I-STAT ARTERIAL BLOOD GAS, ED   ____________________________________________  EKG   EKG Interpretation  Date/Time:  Monday June 19 2018 18:39:54 EST Ventricular Rate:  94 PR Interval:    QRS Duration: 77 QT Interval:  367 QTC Calculation: 459 R Axis:   75 Text Interpretation:  Sinus rhythm Right atrial enlargement LVH with secondary repolarization abnormality ST depression, probably rate related No STEMI.  Confirmed by Alona Bene [phone]) on 06/19/2018 7:11:38 PM       ____________________________________________  RADIOLOGY  Dg Chest Portable 1 View  Result Date: 06/19/2018 CLINICAL DATA:  Initial evaluation for acute shortness of breath. EXAM: PORTABLE CHEST 1 VIEW COMPARISON:  None. FINDINGS: Cardiac and mediastinal silhouettes are within normal limits. Lungs mildly hypoinflated. Attenuation of the pulmonary markings suggestive of underlying emphysema. No focal infiltrates. No edema or effusion. No pneumothorax. No acute osseous abnormality. IMPRESSION: Emphysema.  No other active cardiopulmonary disease. Electronically Signed   By: Rise Mu M.D.   On: 06/19/2018 19:13    ____________________________________________   PROCEDURES  Procedure(s) performed:   Procedures  CRITICAL CARE Performed by: Maia Plan Total critical care time: 35 minutes Critical care time was exclusive of separately billable procedures and treating other patients. Critical care was necessary to treat or prevent imminent or life-threatening deterioration. Critical care was time spent personally by me on the following activities: development of treatment plan with patient and/or surrogate as well as nursing, discussions with consultants, evaluation of patient's response to treatment, examination of patient, obtaining history  from patient or surrogate, ordering and performing treatments and interventions, ordering and review of laboratory studies, ordering and review of radiographic studies, pulse oximetry and re-evaluation of patient's condition.  Alona Bene, MD Emergency Medicine  ____________________________________________   INITIAL IMPRESSION / ASSESSMENT AND PLAN / ED COURSE  Pertinent labs & imaging results that were available during my care of the patient were reviewed by me and considered in my medical decision making (see chart for details).  Resents to the emergency department with acute respiratory distress.  He is requiring assisted ventilations, he is diaphoretic, and noted to have extensive accessory muscle use.  We were able to get of pain dose ketamine and he transitioned easily to BiPAP.  Is not fighting or requiring intubation at this time.  Giving additional nebulizer treatments.  Patient with no oral swelling concern for upper airway pathology.  07:45 PM Patient looking calm and in no significant distress. Continuing BiPAP for now. Labs and CXR reviewed. Plan for obs admit given severity of initial presentation.  Discussed patient's case with Hospitalist to request admission. Patient and family (if present) updated with plan. Care transferred to Hospitalist service.  I reviewed all  nursing notes, vitals, pertinent old records, EKGs, labs, imaging (as available).  ____________________________________________  FINAL CLINICAL IMPRESSION(S) / ED DIAGNOSES  Final diagnoses:  Acute respiratory failure, unspecified whether with hypoxia or hypercapnia (HCC)     MEDICATIONS GIVEN DURING THIS VISIT:  Medications  ipratropium (ATROVENT) nebulizer solution 0.5 mg (0.5 mg Nebulization Given 06/19/18 2341)  levalbuterol (XOPENEX) nebulizer solution 1.25 mg (1.25 mg Nebulization Given 06/19/18 2341)  methylPREDNISolone sodium succinate (SOLU-MEDROL) 125 mg/2 mL injection 60 mg (60 mg Intravenous  Given 06/19/18 2139)  dextromethorphan-guaiFENesin (MUCINEX DM) 30-600 MG per 12 hr tablet 1 tablet (1 tablet Oral Given 06/19/18 2358)  doxycycline (VIBRA-TABS) tablet 100 mg (100 mg Oral Given 06/19/18 2139)  0.9 %  sodium chloride infusion ( Intravenous New Bag/Given 06/19/18 2336)  enoxaparin (LOVENOX) injection 40 mg (has no administration in time range)  nicotine (NICODERM CQ - dosed in mg/24 hours) patch 21 mg (21 mg Transdermal Patch Applied 06/19/18 2342)  ondansetron (ZOFRAN) injection 4 mg (has no administration in time range)  acetaminophen (TYLENOL) tablet 650 mg (650 mg Oral Given 06/19/18 2358)  zolpidem (AMBIEN) tablet 5 mg (5 mg Oral Given 06/19/18 2344)  ketamine 50 mg in normal saline 5 mL (10 mg/mL) syringe (24 mg Intravenous Given 06/19/18 1842)  sodium chloride 0.9 % bolus 500 mL (0 mLs Intravenous Stopped 06/19/18 1932)  albuterol (PROVENTIL) (2.5 MG/3ML) 0.083% nebulizer solution (10 mg  Given 06/19/18 1841)  sodium chloride 0.9 % bolus 1,500 mL (0 mLs Intravenous Stopped 06/19/18 2300)  potassium chloride 20 MEQ/15ML (10%) solution 40 mEq (40 mEq Oral Given 06/19/18 2139)     Note:  This document was prepared using Dragon voice recognition software and may include unintentional dictation errors.  Alona BeneJoshua Xan Sparkman, MD Emergency Medicine    Meaghann Choo, Arlyss RepressJoshua G, MD 06/20/18 Jorje [id number]

## 2018-06-19 NOTE — Progress Notes (Signed)
Patient trialing off BiPAP on RA per Dr. Clyde LundborgNiu.  Patient vitals stable.

## 2018-06-19 NOTE — ED Notes (Signed)
PAGED NUI TO Carmie Kannerallie, RN

## 2018-06-20 ENCOUNTER — Inpatient Hospital Stay (HOSPITAL_COMMUNITY): Payer: Self-pay

## 2018-06-20 DIAGNOSIS — E872 Acidosis, unspecified: Secondary | ICD-10-CM | POA: Diagnosis present

## 2018-06-20 DIAGNOSIS — I361 Nonrheumatic tricuspid (valve) insufficiency: Secondary | ICD-10-CM

## 2018-06-20 LAB — RESPIRATORY PANEL BY PCR
ADENOVIRUS-RVPPCR: NOT DETECTED
Bordetella pertussis: NOT DETECTED
CORONAVIRUS HKU1-RVPPCR: NOT DETECTED
CORONAVIRUS NL63-RVPPCR: NOT DETECTED
CORONAVIRUS OC43-RVPPCR: NOT DETECTED
Chlamydophila pneumoniae: NOT DETECTED
Coronavirus 229E: NOT DETECTED
INFLUENZA A-RVPPCR: NOT DETECTED
Influenza B: NOT DETECTED
Metapneumovirus: NOT DETECTED
Mycoplasma pneumoniae: NOT DETECTED
PARAINFLUENZA VIRUS 1-RVPPCR: NOT DETECTED
PARAINFLUENZA VIRUS 2-RVPPCR: NOT DETECTED
PARAINFLUENZA VIRUS 3-RVPPCR: NOT DETECTED
Parainfluenza Virus 4: NOT DETECTED
RHINOVIRUS / ENTEROVIRUS - RVPPCR: NOT DETECTED
Respiratory Syncytial Virus: NOT DETECTED

## 2018-06-20 LAB — LACTIC ACID, PLASMA
Lactic Acid, Venous: 3.7 mmol/L (ref 0.5–1.9)
Lactic Acid, Venous: 2.5 mmol/L (ref 0.5–1.9)

## 2018-06-20 LAB — EXPECTORATED SPUTUM ASSESSMENT W GRAM STAIN, RFLX TO RESP C

## 2018-06-20 LAB — EXPECTORATED SPUTUM ASSESSMENT W REFEX TO RESP CULTURE

## 2018-06-20 LAB — HIV ANTIBODY (ROUTINE TESTING W REFLEX): HIV Screen 4th Generation wRfx: NONREACTIVE

## 2018-06-20 LAB — ECHOCARDIOGRAM COMPLETE: Weight: 2880 oz

## 2018-06-20 LAB — MAGNESIUM: MAGNESIUM: 1.8 mg/dL (ref 1.7–2.4)

## 2018-06-20 MED ORDER — PENICILLIN V POTASSIUM 500 MG PO TABS: 500.0000 mg | ORAL_TABLET | Freq: Two times a day (BID) | ORAL | 0 refills | Status: DC

## 2018-06-20 MED ORDER — ALBUTEROL SULFATE HFA 108 (90 BASE) MCG/ACT IN AERS: 2.0000 | INHALATION_SPRAY | Freq: Four times a day (QID) | RESPIRATORY_TRACT | 0 refills | Status: AC | PRN

## 2018-06-20 MED ORDER — SODIUM CHLORIDE 0.9 % IV BOLUS
1000.0000 mL | Freq: Once | INTRAVENOUS | Status: AC
  Administered 2018-06-20: 1000 mL via INTRAVENOUS

## 2018-06-20 MED ORDER — INFLUENZA VAC SPLIT QUAD 0.5 ML IM SUSY
0.5000 mL | PREFILLED_SYRINGE | INTRAMUSCULAR | Status: AC
  Administered 2018-06-21: 0.5 mL via INTRAMUSCULAR
  Filled 2018-06-20: qty 0.5

## 2018-06-20 MED ORDER — IPRATROPIUM BROMIDE 0.02 % IN SOLN: 0.5000 mg | Freq: Four times a day (QID) | RESPIRATORY_TRACT | Status: DC

## 2018-06-20 MED ORDER — PREDNISONE 10 MG PO TABS: 10.0000 mg | ORAL_TABLET | Freq: Every day | ORAL | Status: DC

## 2018-06-20 MED ORDER — SODIUM CHLORIDE 0.9 % IV SOLN
1.0000 g | Freq: Every day | INTRAVENOUS | Status: DC
  Administered 2018-06-20 – 2018-06-22 (×4): 1 g via INTRAVENOUS
  Filled 2018-06-20 (×4): qty 10

## 2018-06-20 MED FILL — PENICILLIN VK 500 MG TABLET: 500 | 10 days supply | Qty: 20 | Fill #0

## 2018-06-20 MED FILL — VENTOLIN HFA 90 MCG INHALER: 108 (90 BAS | 25 days supply | Qty: 18 | Fill #0

## 2018-06-20 MED FILL — predniSONE 10 MG TABS: 10 | 8 days supply | Qty: 15 | Fill #0

## 2018-06-20 NOTE — Progress Notes (Signed)
Patient states he feels good, would like to know if he has to stay in the hospital. Discussed with patient's wife at bedside. Both feel comfortable with discharge if ok with MD. Paged and notified Dr Rhona Leavenshiu. New order received to check ambulation O2 sats.

## 2018-06-20 NOTE — ED Notes (Signed)
ED TO INPATIENT HANDOFF REPORT  Name/Age/Gender Jack Baldwin 37 y.o. male  Code Status    Code Status Orders  (From admission, onward)         Start     Ordered   06/19/18 2028  Full code  Continuous     06/19/18 2028        Code Status History    This patient has a current code status but no historical code status.      Home/SNF/Other Home  Chief Complaint Resp Arrest  Level of Care/Admitting Diagnosis ED Disposition    ED Disposition Condition Comment   Admit  Hospital Area: [facility] [[id number]]  Level of Care: Telemetry [5]  Diagnosis: Metabolic acidemia [809983]  Admitting Physician: Donne Hazel [[id number]]  Attending Physician: Donne Hazel [[id number]]  Estimated length of stay: 3 - 4 days  Certification:: I certify this patient will need inpatient services for at least 2 midnights  PT Class (Do Not Modify): Inpatient [101]  PT Acc Code (Do Not Modify): Private [1]       Medical History Past Medical History:  Diagnosis Date  . Smoking     Allergies No Known Allergies  IV Location/Drains/Wounds Patient Lines/Drains/Airways Status   Active Line/Drains/Airways    Name:   Placement date:   Placement time:   Site:   Days:   Peripheral IV 06/19/18 Right Forearm   06/19/18    1844    Forearm   1   Peripheral IV 06/19/18 Left Antecubital   06/19/18    1845    Antecubital   1          Labs/Imaging Results for orders placed or performed during the hospital encounter of 06/19/18 (from the past 48 hour(s))  Comprehensive metabolic panel     Status: Abnormal   Collection Time: 06/19/18  6:43 PM  Result Value Ref Range   Sodium 140 135 - 145 mmol/L   Potassium 3.2 (L) 3.5 - 5.1 mmol/L   Chloride 106 98 - 111 mmol/L   CO2 18 (L) 22 - 32 mmol/L   Glucose, Bld 184 (H) 70 - 99 mg/dL   BUN 5 (L) 6 - 20 mg/dL   Creatinine, Ser 0.97 0.61 - 1.24 mg/dL   Calcium 9.1 8.9 - 10.3 mg/dL   Total Protein 7.4 6.5 - 8.1 g/dL   Albumin 4.6 3.5 -  5.0 g/dL   AST 24 15 - 41 U/L   ALT 12 0 - 44 U/L   Alkaline Phosphatase 59 38 - 126 U/L   Total Bilirubin 0.7 0.3 - 1.2 mg/dL   GFR calc non Af Amer >60 >60 mL/min   GFR calc Af Amer >60 >60 mL/min    Comment: (NOTE) The eGFR has been calculated using the CKD EPI equation. This calculation has not been validated in all clinical situations. eGFR's persistently <60 mL/min signify possible Chronic Kidney Disease.    Anion gap 16 (H) 5 - 15    Comment: Performed at [facility], [place] [address], [place], [place] [address]  Brain natriuretic peptide     Status: None   Collection Time: 06/19/18  6:43 PM  Result Value Ref Range   B Natriuretic Peptide 95.7 0.0 - 100.0 pg/mL    Comment: Performed at [facility] [address] [place], [place] [address]  Troponin I - Once     Status: None   Collection Time: 06/19/18  6:43 PM  Result  Value Ref Range   Troponin I <0.03 <0.03 ng/mL    Comment: Performed at [facility] [address], [place], [place] [address]  CBC with Differential     Status: Abnormal   Collection Time: 06/19/18  6:43 PM  Result Value Ref Range   WBC 15.9 (H) 4.0 - 10.5 K/uL   RBC 5.39 4.22 - 5.81 MIL/uL   Hemoglobin 14.9 13.0 - 17.0 g/dL   HCT 49.1 39.0 - 52.0 %   MCV 91.1 80.0 - 100.0 fL   MCH 27.6 26.0 - 34.0 pg   MCHC 30.3 30.0 - 36.0 g/dL   RDW 13.6 11.5 - 15.5 %   Platelets 298 150 - 400 K/uL   nRBC 0.0 0.0 - 0.2 %   Neutrophils Relative % 74 %   Neutro Abs 11.6 (H) 1.7 - 7.7 K/uL   Lymphocytes Relative 18 %   Lymphs Abs 2.9 0.7 - 4.0 K/uL   Monocytes Relative 8 %   Monocytes Absolute 1.3 (H) 0.1 - 1.0 K/uL   Eosinophils Relative 0 %   Eosinophils Absolute 0.0 0.0 - 0.5 K/uL   Basophils Relative 0 %   Basophils Absolute 0.0 0.0 - 0.1 K/uL   Immature Granulocytes 0 %   Abs Immature Granulocytes 0.06 0.00 - 0.07 K/uL    Comment: Performed at [facility] [address] [place], [place] [address]  I-Stat Venous Blood Gas, ED  (order at [facility] only)     Status: Abnormal   Collection Time: 06/19/18  7:52 PM  Result Value Ref Range   pH, Ven 7.233 (L) 7.250 - 7.430   pCO2, Ven 64.9 (H) 44.0 - 60.0 mmHg   pO2, Ven 27.0 (LL) 32.0 - 45.0 mmHg   Bicarbonate 27.4 20.0 - 28.0 mmol/L   TCO2 29 22 - 32 mmol/L   O2 Saturation 37.0 %   Acid-base deficit 2.0 0.0 - 2.0 mmol/L   Patient temperature 37.0 C    Sample type VENOUS   Culture, blood (x 2)     Status: None (Preliminary result)   Collection Time: 06/19/18 10:15 PM  Result Value Ref Range   Specimen Description BLOOD LEFT ARM    Special Requests      BOTTLES DRAWN AEROBIC AND ANAEROBIC Blood Culture results may not be optimal due to an excessive volume of blood received in culture bottles   Culture      NO GROWTH < 12 HOURS Performed at [facility] [address], [place], [place] [address]    Report Status PENDING   Respiratory Panel by PCR     Status: None   Collection Time: 06/19/18 10:26 PM  Result Value Ref Range   Adenovirus NOT DETECTED NOT DETECTED   Coronavirus 229E NOT DETECTED NOT DETECTED   Coronavirus HKU1 NOT DETECTED NOT DETECTED   Coronavirus NL63 NOT DETECTED NOT DETECTED   Coronavirus OC43 NOT DETECTED NOT DETECTED   Metapneumovirus NOT DETECTED NOT DETECTED   Rhinovirus / Enterovirus NOT DETECTED NOT DETECTED   Influenza A NOT DETECTED NOT DETECTED   Influenza B NOT DETECTED NOT DETECTED   Parainfluenza Virus 1 NOT DETECTED NOT DETECTED   Parainfluenza Virus 2 NOT DETECTED NOT DETECTED   Parainfluenza Virus 3 NOT DETECTED NOT DETECTED   Parainfluenza Virus 4 NOT DETECTED NOT DETECTED   Respiratory Syncytial Virus NOT DETECTED NOT DETECTED   Bordetella pertussis NOT DETECTED NOT DETECTED   Chlamydophila pneumoniae NOT DETECTED NOT DETECTED  Mycoplasma pneumoniae NOT DETECTED NOT DETECTED    Comment: Performed at [facility] [address], [place], [place] [address]  Group A Strep by PCR     Status: Abnormal    Collection Time: 06/19/18 10:26 PM  Result Value Ref Range   Group A Strep by PCR DETECTED (A) NOT DETECTED    Comment: Performed at [facility] [address] [place], [place] [address]  Lactic acid, plasma     Status: Abnormal   Collection Time: 06/19/18 10:38 PM  Result Value Ref Range   Lactic Acid, Venous 4.2 (HH) 0.5 - 1.9 mmol/L    Comment: CRITICAL RESULT CALLED TO, READ BACK BY AND VERIFIED WITH: TOBIAS M,RN 06/19/18 2326 WAYK Performed at [facility], [place] [address], [place], [place] [address]   Procalcitonin     Status: None   Collection Time: 06/19/18 10:38 PM  Result Value Ref Range   Procalcitonin 0.26 ng/mL    Comment:        Interpretation: PCT (Procalcitonin) <= 0.5 ng/mL: Systemic infection (sepsis) is not likely. Local bacterial infection is possible. (NOTE)       Sepsis PCT Algorithm           Lower Respiratory Tract                                      Infection PCT Algorithm    ----------------------------     ----------------------------         PCT < 0.25 ng/mL                PCT < 0.10 ng/mL         Strongly encourage             Strongly discourage   discontinuation of antibiotics    initiation of antibiotics    ----------------------------     -----------------------------       PCT 0.25 - 0.50 ng/mL            PCT 0.10 - 0.25 ng/mL               OR       >80% decrease in PCT            Discourage initiation of                                            antibiotics      Encourage discontinuation           of antibiotics    ----------------------------     -----------------------------         PCT >= 0.50 ng/mL              PCT 0.26 - 0.50 ng/mL               AND        <80% decrease in PCT             Encourage initiation of                                             antibiotics  Encourage continuation           of antibiotics    ----------------------------     -----------------------------        PCT >= 0.50 ng/mL                   PCT > 0.50 ng/mL               AND         increase in PCT                  Strongly encourage                                      initiation of antibiotics    Strongly encourage escalation           of antibiotics                                     -----------------------------                                           PCT <= 0.25 ng/mL                                                 OR                                        > 80% decrease in PCT                                     Discontinue / Do not initiate                                             antibiotics Performed at [facility], [address], [place], [place] [address]   Culture, blood (x 2)     Status: None (Preliminary result)   Collection Time: 06/19/18 10:40 PM  Result Value Ref Range   Specimen Description BLOOD LEFT HAND    Special Requests      BOTTLES DRAWN AEROBIC ONLY Blood Culture adequate volume   Culture      NO GROWTH < 12 HOURS Performed at [facility] [address] [place], [place] [address]    Report Status PENDING   Lactic acid, plasma     Status: Abnormal   Collection Time: 06/20/18  2:58 AM  Result Value Ref Range   Lactic Acid, Venous 3.7 (HH) 0.5 - 1.9 mmol/L    Comment: CRITICAL RESULT CALLED TO, READ BACK BY AND VERIFIED WITH: TOBIAS M,RN 06/20/18 0420 WAYK Performed at [facility], [place] [address] [place], [place] [address]   Magnesium     Status: None   Collection Time: 06/20/18  2:58  AM  Result Value Ref Range   Magnesium 1.8 1.7 - 2.4 mg/dL    Comment: Performed at [facility], [place] [address] [place], [place] [address]  Lactic acid, plasma     Status: Abnormal   Collection Time: 06/20/18  6:27 AM  Result Value Ref Range   Lactic Acid, Venous 2.5 (HH) 0.5 - 1.9 mmol/L    Comment: CRITICAL RESULT CALLED TO, READ BACK BY AND VERIFIED WITH: Ginger Carne RN [id number] 06/20/2018 BY A BENNETT Performed at [facility], [place] [address]  [place], [place] [address]    Dg Chest Portable 1 View  Result Date: 06/19/2018 CLINICAL DATA:  Initial evaluation for acute shortness of breath. EXAM: PORTABLE CHEST 1 VIEW COMPARISON:  None. FINDINGS: Cardiac and mediastinal silhouettes are within normal limits. Lungs mildly hypoinflated. Attenuation of the pulmonary markings suggestive of underlying emphysema. No focal infiltrates. No edema or effusion. No pneumothorax. No acute osseous abnormality. IMPRESSION: Emphysema.  No other active cardiopulmonary disease. Electronically Signed   By: Jeannine Boga M.D.   On: 06/19/2018 19:13    Pending Labs Unresulted Labs (From admission, onward)    Start     Ordered   06/20/18 0257  Lactic acid, plasma  STAT Now then every 3 hours,   R     06/20/18 0256   06/19/18 2029  Culture, sputum-assessment  Once,   R     06/19/18 2028   06/19/18 2027  HIV antibody (Routine Testing)  Once,   R     06/19/18 2028          Vitals/Pain Today's Vitals   06/20/18 0900 06/20/18 1025 06/20/18 1033 06/20/18 1119  BP: 99/70  100/80 101/71  Pulse:  82 82 82  Resp: '20 16 16 18  '$ Temp:      TempSrc:      SpO2:  99% 99% 99%  Weight:      PainSc:        Isolation Precautions Droplet precaution  Medications Medications  ipratropium (ATROVENT) nebulizer solution 0.5 mg (0.5 mg Nebulization Given 06/20/18 0832)  levalbuterol (XOPENEX) nebulizer solution 1.25 mg (1.25 mg Nebulization Given 06/20/18 0549)  methylPREDNISolone sodium succinate (SOLU-MEDROL) 125 mg/2 mL injection 60 mg (60 mg Intravenous Given 06/20/18 1029)  dextromethorphan-guaiFENesin (MUCINEX DM) 30-600 MG per 12 hr tablet 1 tablet (1 tablet Oral Given 06/19/18 2358)  0.9 %  sodium chloride infusion ( Intravenous Rate/Dose Verify 06/20/18 0722)  enoxaparin (LOVENOX) injection 40 mg (has no administration in time range)  nicotine (NICODERM CQ - dosed in mg/24 hours) patch 21 mg (21 mg Transdermal Patch Applied 06/20/18 1029)  ondansetron  (ZOFRAN) injection 4 mg (has no administration in time range)  acetaminophen (TYLENOL) tablet 650 mg (650 mg Oral Given 06/19/18 2358)  zolpidem (AMBIEN) tablet 5 mg (5 mg Oral Given 06/19/18 2344)  cefTRIAXone (ROCEPHIN) 1 g in sodium chloride 0.9 % 100 mL IVPB (0 g Intravenous Stopped 06/20/18 0205)  ketamine 50 mg in normal saline 5 mL (10 mg/mL) syringe (24 mg Intravenous Given 06/19/18 1842)  sodium chloride 0.9 % bolus 500 mL (0 mLs Intravenous Stopped 06/19/18 1932)  albuterol (PROVENTIL) (2.5 MG/3ML) 0.083% nebulizer solution (10 mg  Given 06/19/18 1841)  sodium chloride 0.9 % bolus 1,500 mL (0 mLs Intravenous Stopped 06/19/18 2300)  potassium chloride 20 MEQ/15ML (10%) solution 40 mEq (40 mEq Oral Given 06/19/18 2139)  sodium chloride 0.9 % bolus 1,000 mL (0 mLs Intravenous Stopped 06/20/18 0236)  sodium  chloride 0.9 % bolus 1,000 mL (0 mLs Intravenous Stopped 06/20/18 0355)    Mobility walks

## 2018-06-20 NOTE — Care Management Note (Signed)
Case Management Note  Patient Details  Name: Jack Baldwin MRN: [id number] Date of Birth: 08/07/1980  Subjective/Objective:  From home, pta indep, for dc today after 2 d echo. [facility] with ast of [employer].  They will fill and bring to patients room.  Mel  RN and MD informed.                    Action/Plan: DC home when ready.   Expected Discharge Date:                  Expected Discharge Plan:  Home/Self Care  In-House Referral:     Discharge planning Services  CM Consult, Follow-up appt scheduled, [facility]igent [facility] Clinic, Medication Assistance, [facility]  Post Acute Care Choice:    Choice offered to:     DME Arranged:    DME Agency:     HH Arranged:    HH Agency:     Status of Service:  Completed, signed off  If discussed at [employer] Length of Stay Meetings, dates discussed:    Additional Comments:  Leone Havenaylor, Taunya Goral Clinton, RN 06/20/2018, 1:51 PM

## 2018-06-20 NOTE — Progress Notes (Signed)
PROGRESS NOTE    Jack Baldwin  ZDG:[id number] DOB: 1981-06-05 DOA: 06/19/2018 PCP: Patient, No Pcp Per    Brief Narrative:  37 y.o.malewith medical history significant oftobacco abuse, who presents with shortness of breath.  Patient states that he started having shortness of breath today, which has been progressively and rapidly worsening.He has a dry cough, but had no chest pain, fever or chills. He also reports sore throat.Per ED physician,patient wasinitialyin severe respiratory distress, very diaphoretic, could notspeak in full sentences, breathing is significantly labored. Patient was given Solu-Medrol, 2 duo nebs, 2 intramuscular epinephrines,2 g of magnesium,and supported with assist ventilationsby EMSin route.Pt was given one dose of Ketamine and started BIPAP in ED. Hisrespiratory distress has improved partiallywhen I saw pt in ED, but still needs BiPAP.Patient does not have nausea vomiting, diarrhea or abdominal pain pain no symptoms of UTI or unilateral weakness.Patient has been smoking heavily since age of 29, at least one pack a day.   ED Course:pt was found to have WBC 15.9, negative troponin, BNP 95.7, potassium 3.2 creatinine normal, temperature 97, tachycardia, tachypnea, oxygen saturation 100% on BiPAP. Chest x-ray showed emphysema without infiltration. Patient is placed on stepdown bed for observation  Assessment & Plan:   Principal Problem:   Acute respiratory failure with hypoxia (HCC) Active Problems:   Sepsis (HCC)   Hypokalemia   Tobacco abuse   Metabolic acidemia  Acute respiratory failure with hypoxia: -Resolved, now on min O2 support -Improved with steroids, neb tx, abx  Sepsis due topossible COPD exacerbationand strep throat: -Patient meets criteria for sepsis with leukocytosis, tachycardia and tachypnea. -Sepsis physiology resolved -Lactate improved -Rapid strep pos  -Continued on IV rocephin  AV Calcification,  Murmur -Systolic murmur noted on exam -Heavy calcification noted on 2d echo, reviewed, with recommendation for follow up TEE -Discussed with Cardiology, who will follow for arranging TEE -Continue IV abx per above  Hypokalemia:  - Replaced. Repeat lytes in AM  Tobacco abuse -Did counseling about importance of quitting smoking -Nicotine patch continued  DVT prophylaxis: Lovenox subQ Code Status: Full Family Communication: Pt in room, family at bedside Disposition Plan: Uncertain at this time  Consultants:   Cardiology  Procedures:   2d echo 11/26  Antimicrobials: Anti-infectives (From admission, onward)   Start     Dose/Rate Route Frequency Ordered Stop   06/20/18 0130  cefTRIAXone (ROCEPHIN) 1 g in sodium chloride 0.9 % 100 mL IVPB     1 g 200 mL/hr over 30 Minutes Intravenous Daily at bedtime 06/20/18 0043     06/20/18 0000  penicillin v potassium (VEETID) 500 MG tablet     500 mg Oral 2 times daily 06/20/18 1259 06/30/18 2359   06/19/18 2200  doxycycline (VIBRA-TABS) tablet 100 mg  Status:  Discontinued     100 mg Oral Every 12 hours 06/19/18 2024 06/20/18 0044       Subjective: Very eager to go home  Objective: Vitals:   06/20/18 1212 06/20/18 1236 06/20/18 1530 06/20/18 1601  BP: 104/74   107/79  Pulse: 87   93  Resp: 18     Temp: 98.6 F (37 C)   97.7 F (36.5 C)  TempSrc: Oral   Oral  SpO2: 100% 99% 100% 100%  Weight:        Intake/Output Summary (Last 24 hours) at 06/20/2018 1702 Last data filed at 06/20/2018 0355 Gross per 24 hour  Intake 7423.63 ml  Output 750 ml  Net 6673.63 ml   [employer]  06/19/18 1800  Weight: 81.6 kg    Examination:  General exam: Appears calm and comfortable  Respiratory system: Clear to auscultation. Respiratory effort normal. Cardiovascular system: S1 & S2 heard, RRR, systolic murmur Gastrointestinal system: Abdomen is nondistended, soft and nontender. No organomegaly or masses felt. Normal bowel  sounds heard. Central nervous system: Alert and oriented. No focal neurological deficits. Extremities: Symmetric 5 x 5 power. Skin: No rashes, lesions Psychiatry: Judgement and insight appear normal. Mood & affect appropriate.   Data Reviewed: I have personally reviewed following labs and imaging studies  CBC: Recent Labs  Lab 06/19/18 1843  WBC 15.9*  NEUTROABS 11.6*  HGB 14.9  HCT 49.1  MCV 91.1  PLT 298   Basic Metabolic Panel: Recent Labs  Lab 06/19/18 1843 06/20/18 0258  NA 140  --   K 3.2*  --   CL 106  --   CO2 18*  --   GLUCOSE 184*  --   BUN 5*  --   CREATININE 0.97  --   CALCIUM 9.1  --   MG  --  1.8   GFR: CrCl cannot be calculated (Unknown ideal weight.). Liver Function Tests: Recent Labs  Lab 06/19/18 1843  AST 24  ALT 12  ALKPHOS 59  BILITOT 0.7  PROT 7.4  ALBUMIN 4.6   No results for input(s): LIPASE, AMYLASE in the last 168 hours. No results for input(s): AMMONIA in the last 168 hours. Coagulation Profile: No results for input(s): INR, PROTIME in the last 168 hours. Cardiac Enzymes: Recent Labs  Lab 06/19/18 1843  TROPONINI <0.03   BNP (last 3 results) No results for input(s): PROBNP in the last 8760 hours. HbA1C: No results for input(s): HGBA1C in the last 72 hours. CBG: No results for input(s): GLUCAP in the last 168 hours. Lipid Profile: No results for input(s): CHOL, HDL, LDLCALC, TRIG, CHOLHDL, LDLDIRECT in the last 72 hours. Thyroid Function Tests: No results for input(s): TSH, T4TOTAL, FREET4, T3FREE, THYROIDAB in the last 72 hours. Anemia Panel: No results for input(s): VITAMINB12, FOLATE, FERRITIN, TIBC, IRON, RETICCTPCT in the last 72 hours. Sepsis Labs: Recent Labs  Lab 06/19/18 2238 06/20/18 0258 06/20/18 0627  PROCALCITON 0.26  --   --   LATICACIDVEN 4.2* 3.7* 2.5*    Recent Results (from the past 240 hour(s))  Culture, blood (x 2)     Status: None (Preliminary result)   Collection Time: 06/19/18 10:15 PM    Result Value Ref Range Status   Specimen Description BLOOD LEFT ARM  Final   Special Requests   Final    BOTTLES DRAWN AEROBIC AND ANAEROBIC Blood Culture results may not be optimal due to an excessive volume of blood received in culture bottles   Culture   Final    NO GROWTH < 24 HOURS Performed at [facility], [address] [place], [place] [address]    Report Status PENDING  Incomplete  Respiratory Panel by PCR     Status: None   Collection Time: 06/19/18 10:26 PM  Result Value Ref Range Status   Adenovirus NOT DETECTED NOT DETECTED Final   Coronavirus 229E NOT DETECTED NOT DETECTED Final   Coronavirus HKU1 NOT DETECTED NOT DETECTED Final   Coronavirus NL63 NOT DETECTED NOT DETECTED Final   Coronavirus OC43 NOT DETECTED NOT DETECTED Final   Metapneumovirus NOT DETECTED NOT DETECTED Final   Rhinovirus / Enterovirus NOT DETECTED NOT DETECTED Final   Influenza A NOT DETECTED NOT DETECTED Final  Influenza B NOT DETECTED NOT DETECTED Final   Parainfluenza Virus 1 NOT DETECTED NOT DETECTED Final   Parainfluenza Virus 2 NOT DETECTED NOT DETECTED Final   Parainfluenza Virus 3 NOT DETECTED NOT DETECTED Final   Parainfluenza Virus 4 NOT DETECTED NOT DETECTED Final   Respiratory Syncytial Virus NOT DETECTED NOT DETECTED Final   Bordetella pertussis NOT DETECTED NOT DETECTED Final   Chlamydophila pneumoniae NOT DETECTED NOT DETECTED Final   Mycoplasma pneumoniae NOT DETECTED NOT DETECTED Final    Comment: Performed at [facility], [address] [place], [place] [address]  Group A Strep by PCR     Status: Abnormal   Collection Time: 06/19/18 10:26 PM  Result Value Ref Range Status   Group A Strep by PCR DETECTED (A) NOT DETECTED Final    Comment: Performed at [facility], [address], [place], [place] [address]  Culture, blood (x 2)     Status: None (Preliminary result)   Collection Time: 06/19/18 10:40 PM  Result Value Ref Range Status   Specimen  Description BLOOD LEFT HAND  Final   Special Requests   Final    BOTTLES DRAWN AEROBIC ONLY Blood Culture adequate volume   Culture   Final    NO GROWTH < 24 HOURS Performed at [facility], [address] [place], [place] [address]    Report Status PENDING  Incomplete  Culture, sputum-assessment     Status: None   Collection Time: 06/20/18 10:32 AM  Result Value Ref Range Status   Specimen Description SPUTUM  Final   Special Requests NONE  Final   Sputum evaluation   Final    Sputum specimen not acceptable for testing.  Please recollect.   Gram Stain Report Called to,Read Back By and Verified With: RN M TURNER 06/20/18 1200 MLM Performed at [facility], [address] [place], [place] [address]    Report Status 06/20/2018 FINAL  Final     Radiology Studies: Dg Chest Portable 1 View  Result Date: 06/19/2018 CLINICAL DATA:  Initial evaluation for acute shortness of breath. EXAM: PORTABLE CHEST 1 VIEW COMPARISON:  None. FINDINGS: Cardiac and mediastinal silhouettes are within normal limits. Lungs mildly hypoinflated. Attenuation of the pulmonary markings suggestive of underlying emphysema. No focal infiltrates. No edema or effusion. No pneumothorax. No acute osseous abnormality. IMPRESSION: Emphysema.  No other active cardiopulmonary disease. Electronically Signed   By: Rise Mu M.D.   On: 06/19/2018 19:13    Scheduled Meds: . enoxaparin (LOVENOX) injection  40 mg Subcutaneous Q24H  . ipratropium  0.5 mg Nebulization Q4H  . levalbuterol  1.25 mg Nebulization Q6H  . methylPREDNISolone (SOLU-MEDROL) injection  60 mg Intravenous TID  . nicotine  21 mg Transdermal Daily   Continuous Infusions: . sodium chloride 150 mL/hr at 06/20/18 0722  . cefTRIAXone (ROCEPHIN)  IV Stopped (06/20/18 0205)     LOS: 0 days   Rickey Barbara, MD [facility] Pager On Amion  If 7PM-7AM, please contact night-coverage 06/20/2018, 5:02 PM

## 2018-06-20 NOTE — Progress Notes (Signed)
Received patient to 2W11 in a wheelchair with running IVF. Patient stable on room air. Placed on monitor per order. Assessment benign, except breath sounds diminished lower lobes. Patient's wife accompanied him at bedside. Skin assessed with Marcelino DusterMichelle, charge nurse. Skin assessment benign.

## 2018-06-20 NOTE — ED Notes (Signed)
[facility] states unable to take pt with lactic acid >3. Christa SeePaging Niu, MD to obtain order for repeat lactic acid since receiving IV fluids and abx.

## 2018-06-20 NOTE — ED Notes (Signed)
Attempted report 

## 2018-06-20 NOTE — ED Notes (Signed)
RN messaged admitting about lowering level of care due to last lactic less than 3.

## 2018-06-20 NOTE — Progress Notes (Signed)
  Echocardiogram 2D Echocardiogram has been performed.  Leta JunglingCooper, Jazmynn Pho M 06/20/2018, 2:30 PM

## 2018-06-20 NOTE — Progress Notes (Signed)
SATURATION QUALIFICATIONS: (This note is used to comply with regulatory documentation for home oxygen)  Patient Saturations on Room Air at Rest = 100%  Patient Saturations on Room Air while Ambulating = 98% Patient ambulated 500 feet. Patient saturations on room air after ambulating = 100%  Please briefly explain why patient needs home oxygen: Patient does not need oxygen.

## 2018-06-21 DIAGNOSIS — A419 Sepsis, unspecified organism: Principal | ICD-10-CM

## 2018-06-21 DIAGNOSIS — J9601 Acute respiratory failure with hypoxia: Secondary | ICD-10-CM

## 2018-06-21 LAB — CBC
HCT: 35 % — ABNORMAL LOW (ref 39.0–52.0)
Hemoglobin: 11.3 g/dL — ABNORMAL LOW (ref 13.0–17.0)
MCH: 27.6 pg (ref 26.0–34.0)
MCHC: 32.3 g/dL (ref 30.0–36.0)
MCV: 85.6 fL (ref 80.0–100.0)
NRBC: 0 % (ref 0.0–0.2)
Platelets: 243 10*3/uL (ref 150–400)
RBC: 4.09 MIL/uL — ABNORMAL LOW (ref 4.22–5.81)
RDW: 13.7 % (ref 11.5–15.5)
WBC: 16.9 10*3/uL — ABNORMAL HIGH (ref 4.0–10.5)

## 2018-06-21 LAB — BASIC METABOLIC PANEL
Anion gap: 6 (ref 5–15)
BUN: 8 mg/dL (ref 6–20)
CO2: 24 mmol/L (ref 22–32)
CREATININE: 0.78 mg/dL (ref 0.61–1.24)
Calcium: 8.9 mg/dL (ref 8.9–10.3)
Chloride: 110 mmol/L (ref 98–111)
Glucose, Bld: 134 mg/dL — ABNORMAL HIGH (ref 70–99)
Potassium: 4 mmol/L (ref 3.5–5.1)
SODIUM: 140 mmol/L (ref 135–145)

## 2018-06-21 MED ORDER — IPRATROPIUM BROMIDE 0.02 % IN SOLN: 0.5000 mg | RESPIRATORY_TRACT | Status: DC | PRN

## 2018-06-21 MED ORDER — LEVALBUTEROL HCL 1.25 MG/0.5ML IN NEBU
1.2500 mg | INHALATION_SOLUTION | Freq: Three times a day (TID) | RESPIRATORY_TRACT | Status: DC
  Administered 2018-06-21 (×3): 1.25 mg via RESPIRATORY_TRACT
  Filled 2018-06-21 (×3): qty 0.5

## 2018-06-21 MED ORDER — IPRATROPIUM BROMIDE 0.02 % IN SOLN
0.5000 mg | Freq: Three times a day (TID) | RESPIRATORY_TRACT | Status: DC
  Administered 2018-06-21 (×3): 0.5 mg via RESPIRATORY_TRACT
  Filled 2018-06-21 (×3): qty 2.5

## 2018-06-21 MED ORDER — LEVALBUTEROL HCL 1.25 MG/0.5ML IN NEBU: 1.2500 mg | INHALATION_SOLUTION | RESPIRATORY_TRACT | Status: DC | PRN

## 2018-06-21 NOTE — Progress Notes (Signed)
   [facility] has been requested to perform a transesophageal echocardiogram [name] for further evaluation of heavy calcification on the aortic valve noted on 2-D echocardiogram on 06/20/2018.  After careful review of history and examination, the risks and benefits of transesophageal echocardiogram have been explained including risks of esophageal damage, perforation (1:10,000 risk), bleeding, pharyngeal hematoma as well as other potential complications associated with conscious sedation including aspiration, arrhythmia, respiratory failure and death. Alternatives to treatment were discussed, questions were answered. Patient is willing to proceed.   Procedure is scheduled for 06/23/2018 at 8:45am with Dr. Royann Shiversroitoru.   Jack ParkerCallie E Hiroyuki Ozanich, PA-C 06/21/2018 12:17 PM

## 2018-06-21 NOTE — Progress Notes (Signed)
PROGRESS NOTE  Jack Baldwin MWU:[id number] DOB: [birth year] DOA: 06/19/2018 PCP: Patient, No Pcp Per   LOS: 1 day   Brief Narrative / Interim history: 36 year old male with history of tobacco use who presented with shortness of breath, progressive and rapidly worsening.  He required BiPAP in the ED, he was given Solu-Medrol, nebulizers, IM epinephrine, magnesium with improvement in his respiratory status.  He was found to have strep throat  Subjective: -Doing well this morning, no complaints.  No chest pain, no shortness of breath  Assessment & Plan: Principal Problem:   Acute respiratory failure with hypoxia (HCC) Active Problems:   Sepsis (HCC)   Hypokalemia   Tobacco abuse   Metabolic acidemia   Acute hypoxic respiratory failure -Resolved, now on room air  Sepsis possible to COPD exacerbation and strep throat -Patient met criteria for sepsis with leukocytosis, tachycardia, tachypnea, sepsis is now resolved -Continue IV ceftriaxone  AV calcification, murmur -Heavy calcification on 2D echo, recommended a TEE, discussed with cardiology and with suspicion for endocarditis recommend to stay inpatient, for IV antibiotics and get a TEE on Friday  Hypokalemia -Resolved, K now normal  Tobacco abuse -Counseled for cessation  Scheduled Meds: . enoxaparin (LOVENOX) injection  40 mg Subcutaneous Q24H  . ipratropium  0.5 mg Nebulization TID  . levalbuterol  1.25 mg Nebulization TID  . methylPREDNISolone (SOLU-MEDROL) injection  60 mg Intravenous TID  . nicotine  21 mg Transdermal Daily   Continuous Infusions: . cefTRIAXone (ROCEPHIN)  IV 1 g (06/20/18 2333)   PRN Meds:.acetaminophen, dextromethorphan-guaiFENesin, ondansetron (ZOFRAN) IV, zolpidem  DVT prophylaxis: Lovenox Code Status: Full code Family Communication: Significant other present at bedside Disposition Plan: Home of the TEE pending on results  Consultants:   None   Procedures:   2D echo:  Study  Conclusions - Left ventricle: The cavity size was normal. Systolic function was normal. The estimated ejection fraction was in the range of 60% to 65%. Wall motion was normal; there were no regional wall motion abnormalities. - Aortic valve: There is heavy calcification of the aortic annulus that appears to possibly involve the right and left coronary cusps but difficult to discern. Recommend TEE for further evaluation. Severely calcified annulus. Trileaflet; normal thickness leaflets. Valve area (VTI): 1.25 cm^2. Valve area (Vmax): 1.21 cm^2. Valve area (Vmean): 1.16 cm^2. - Mitral valve: There was mild regurgitation. - Tricuspid valve: There was mild regurgitation. - Pulmonic valve: There was trivial regurgitation. - Pulmonary arteries: PA peak pressure: 34 mm Hg (S). - Inferior vena cava: The vessel was mildly dilated. The respirophasic diameter changes were in the normal range (>= 50%).  Antimicrobials:  Ceftriaxone    Objective: Vitals:   06/20/18 2016 06/20/18 2342 06/21/18 0727 06/21/18 0757  BP:  114/75 112/76   Pulse:  89 81   Resp:  16    Temp:  98.9 F (37.2 C) 98.5 F (36.9 C)   TempSrc:  Oral Oral   SpO2: 98% 100% 100% 100%  Weight:      Height:        Intake/Output Summary (Last 24 hours) at 06/21/2018 1323 Last data filed at 06/21/2018 0400 Gross per 24 hour  Intake 98.95 ml  Output -  Net 98.95 ml   Filed Weights   06/19/18 1800 06/20/18 1601  Weight: 81.6 kg 70.6 kg    Examination:  Constitutional: NAD Eyes: lids and conjunctivae normal ENMT: Mucous membranes are moist. No oropharyngeal exudates Neck: normal, supple Respiratory: clear to auscultation bilaterally, no wheezing, no  crackles. Normal respiratory effort. No accessory muscle use.   Cardiovascular: Regular rate and rhythm, 3/6 SEM. No LE edema. 2+ pedal pulses. No carotid bruits.  Abdomen: no tenderness. Bowel sounds positive.  Musculoskeletal: no clubbing / cyanosis. No joint deformity  upper and lower extremities. No contractures. Normal muscle tone.  Skin: no rashes, lesions, ulcers. No induration   Data Reviewed: I have independently reviewed following labs and imaging studies   CBC: Recent Labs  Lab 06/19/18 1843 06/21/18 0232  WBC 15.9* 16.9*  NEUTROABS 11.6*  --   HGB 14.9 11.3*  HCT 49.1 35.0*  MCV 91.1 85.6  PLT 298 379   Basic Metabolic Panel: Recent Labs  Lab 06/19/18 1843 06/20/18 0258 06/21/18 0232  NA 140  --  140  K 3.2*  --  4.0  CL 106  --  110  CO2 18*  --  24  GLUCOSE 184*  --  134*  BUN 5*  --  8  CREATININE 0.97  --  0.78  CALCIUM 9.1  --  8.9  MG  --  1.8  --    GFR: Estimated Creatinine Clearance: 126.2 mL/min (by C-G formula based on SCr of 0.78 mg/dL). Liver Function Tests: Recent Labs  Lab 06/19/18 1843  AST 24  ALT 12  ALKPHOS 59  BILITOT 0.7  PROT 7.4  ALBUMIN 4.6   No results for input(s): LIPASE, AMYLASE in the last 168 hours. No results for input(s): AMMONIA in the last 168 hours. Coagulation Profile: No results for input(s): INR, PROTIME in the last 168 hours. Cardiac Enzymes: Recent Labs  Lab 06/19/18 1843  TROPONINI <0.03   BNP (last 3 results) No results for input(s): PROBNP in the last 8760 hours. HbA1C: No results for input(s): HGBA1C in the last 72 hours. CBG: No results for input(s): GLUCAP in the last 168 hours. Lipid Profile: No results for input(s): CHOL, HDL, LDLCALC, TRIG, CHOLHDL, LDLDIRECT in the last 72 hours. Thyroid Function Tests: No results for input(s): TSH, T4TOTAL, FREET4, T3FREE, THYROIDAB in the last 72 hours. Anemia Panel: No results for input(s): VITAMINB12, FOLATE, FERRITIN, TIBC, IRON, RETICCTPCT in the last 72 hours. Urine analysis: No results found for: COLORURINE, APPEARANCEUR, LABSPEC, PHURINE, GLUCOSEU, HGBUR, BILIRUBINUR, KETONESUR, PROTEINUR, UROBILINOGEN, NITRITE, LEUKOCYTESUR Sepsis Labs: Invalid input(s): PROCALCITONIN, LACTICIDVEN  Recent Results (from the  past 240 hour(s))  Culture, blood (x 2)     Status: None (Preliminary result)   Collection Time: 06/19/18 10:15 PM  Result Value Ref Range Status   Specimen Description BLOOD LEFT ARM  Final   Special Requests   Final    BOTTLES DRAWN AEROBIC AND ANAEROBIC Blood Culture results may not be optimal due to an excessive volume of blood received in culture bottles   Culture   Final    NO GROWTH 2 DAYS Performed at [facility] [address], [place], [place] [address]    Report Status PENDING  Incomplete  Respiratory Panel by PCR     Status: None   Collection Time: 06/19/18 10:26 PM  Result Value Ref Range Status   Adenovirus NOT DETECTED NOT DETECTED Final   Coronavirus 229E NOT DETECTED NOT DETECTED Final   Coronavirus HKU1 NOT DETECTED NOT DETECTED Final   Coronavirus NL63 NOT DETECTED NOT DETECTED Final   Coronavirus OC43 NOT DETECTED NOT DETECTED Final   Metapneumovirus NOT DETECTED NOT DETECTED Final   Rhinovirus / Enterovirus NOT DETECTED NOT DETECTED Final   Influenza A NOT DETECTED NOT DETECTED Final  Influenza B NOT DETECTED NOT DETECTED Final   Parainfluenza Virus 1 NOT DETECTED NOT DETECTED Final   Parainfluenza Virus 2 NOT DETECTED NOT DETECTED Final   Parainfluenza Virus 3 NOT DETECTED NOT DETECTED Final   Parainfluenza Virus 4 NOT DETECTED NOT DETECTED Final   Respiratory Syncytial Virus NOT DETECTED NOT DETECTED Final   Bordetella pertussis NOT DETECTED NOT DETECTED Final   Chlamydophila pneumoniae NOT DETECTED NOT DETECTED Final   Mycoplasma pneumoniae NOT DETECTED NOT DETECTED Final    Comment: Performed at [facility], [place] [address] [place], [place] [address]  Group A Strep by PCR     Status: Abnormal   Collection Time: 06/19/18 10:26 PM  Result Value Ref Range Status   Group A Strep by PCR DETECTED (A) NOT DETECTED Final    Comment: Performed at [facility], [place] [address], [place], [place] [address]  Culture, blood (x 2)     Status: None  (Preliminary result)   Collection Time: 06/19/18 10:40 PM  Result Value Ref Range Status   Specimen Description BLOOD LEFT HAND  Final   Special Requests   Final    BOTTLES DRAWN AEROBIC ONLY Blood Culture adequate volume   Culture   Final    NO GROWTH 2 DAYS Performed at [facility], [place] [address], [place], [place] [address]    Report Status PENDING  Incomplete  Culture, sputum-assessment     Status: None   Collection Time: 06/20/18 10:32 AM  Result Value Ref Range Status   Specimen Description SPUTUM  Final   Special Requests NONE  Final   Sputum evaluation   Final    Sputum specimen not acceptable for testing.  Please recollect.   Gram Stain Report Called to,Read Back By and Verified With: RN M TURNER 06/20/18 1200 MLM Performed at [facility] [address], [place], [place] [address]    Report Status 06/20/2018 FINAL  Final      Radiology Studies: Dg Chest Portable 1 View  Result Date: 06/19/2018 CLINICAL DATA:  Initial evaluation for acute shortness of breath. EXAM: PORTABLE CHEST 1 VIEW COMPARISON:  None. FINDINGS: Cardiac and mediastinal silhouettes are within normal limits. Lungs mildly hypoinflated. Attenuation of the pulmonary markings suggestive of underlying emphysema. No focal infiltrates. No edema or effusion. No pneumothorax. No acute osseous abnormality. IMPRESSION: Emphysema.  No other active cardiopulmonary disease. Electronically Signed   By: Jeannine Boga M.D.   On: 06/19/2018 19:13    Marzetta Board, MD, PhD [facility] Pager [phone]  If 7PM-7AM, please contact night-coverage [email or web address] Password [email or web address] 06/21/2018, 1:23 PM

## 2018-06-21 NOTE — Progress Notes (Signed)
   TEE unfortunately is available on Friday. He has been placed on schedule for then.   Donato SchultzMark Skains, MD

## 2018-06-22 MED ORDER — SODIUM CHLORIDE 0.9 % IV SOLN
INTRAVENOUS | Status: DC
  Administered 2018-06-22: 23:00:00 via INTRAVENOUS

## 2018-06-22 NOTE — Progress Notes (Signed)
PROGRESS NOTE  Jack Baldwin NTZ:[id number] DOB: February 05, 1981 DOA: 06/19/2018 PCP: Patient, No Pcp Per   LOS: 2 days   Brief Narrative / Interim history: 37 year old male with history of tobacco use who presented with shortness of breath, progressive and rapidly worsening.  He required BiPAP in the ED, he was given Solu-Medrol, nebulizers, IM epinephrine, magnesium with improvement in his respiratory status.  He was found to have strep throat  Subjective: -No complaints, awaiting TEE tomorrow  Assessment & Plan: Principal Problem:   Acute respiratory failure with hypoxia (HCC) Active Problems:   Sepsis ([place])   Hypokalemia   Tobacco abuse   Metabolic acidemia   Acute hypoxic respiratory failure -Resolved, now on room air  Sepsis possible to COPD exacerbation and strep throat -Patient met criteria for sepsis with leukocytosis, tachycardia, tachypnea, sepsis is now resolved -Continue IV ceftriaxone.  Cultures are negative.  AV calcification, murmur -Heavy calcification on 2D echo, recommended a TEE, discussed with cardiology and with suspicion for endocarditis recommend to stay inpatient, for IV antibiotics and get a TEE on Friday  Hypokalemia -Resolved, K now normal  Tobacco abuse -Counseled for cessation   Scheduled Meds: . enoxaparin (LOVENOX) injection  40 mg Subcutaneous Q24H  . methylPREDNISolone (SOLU-MEDROL) injection  60 mg Intravenous TID  . nicotine  21 mg Transdermal Daily   Continuous Infusions: . cefTRIAXone (ROCEPHIN)  IV 1 g (06/21/18 2244)   PRN Meds:.acetaminophen, dextromethorphan-guaiFENesin, ipratropium, levalbuterol, ondansetron (ZOFRAN) IV, zolpidem  DVT prophylaxis: Lovenox Code Status: Full code Family Communication: Significant other present at bedside Disposition Plan: Home of the TEE pending on results  Consultants:   None   Procedures:   2D echo:  Study Conclusions - Left ventricle: The cavity size was normal. Systolic function  was normal. The estimated ejection fraction was in the range of 60% to 65%. Wall motion was normal; there were no regional wall motion abnormalities. - Aortic valve: There is heavy calcification of the aortic annulus that appears to possibly involve the right and left coronary cusps but difficult to discern. Recommend TEE for further evaluation. Severely calcified annulus. Trileaflet; normal thickness leaflets. Valve area (VTI): 1.25 cm^2. Valve area (Vmax): 1.21 cm^2. Valve area (Vmean): 1.16 cm^2. - Mitral valve: There was mild regurgitation. - Tricuspid valve: There was mild regurgitation. - Pulmonic valve: There was trivial regurgitation. - Pulmonary arteries: PA peak pressure: 34 mm Hg (S). - Inferior vena cava: The vessel was mildly dilated. The respirophasic diameter changes were in the normal range (>= 50%).  Antimicrobials:  Ceftriaxone    Objective: Vitals:   06/21/18 1713 06/21/18 2011 06/21/18 2244 06/22/18 0722  BP: 121/66  110/79 125/89  Pulse: 83  79 75  Resp: '14  18 17  '$ Temp: 99.5 F (37.5 C)  (!) 97.5 F (36.4 C) 98.4 F (36.9 C)  TempSrc: Oral  Oral Oral  SpO2: 100% 99% 100% 100%  Weight:      Height:       No intake or output data in the 24 hours ending 06/22/18 1040 Filed Weights   06/19/18 1800 06/20/18 1601  Weight: 81.6 kg 70.6 kg    Examination:  Constitutional: NAD Respiratory: no wheezing  Cardiovascular: Regular rate and rhythm, 3/6 SEM. No new murmurs  Data Reviewed: I have independently reviewed following labs and imaging studies   CBC: Recent Labs  Lab 06/19/18 1843 06/21/18 0232  WBC 15.9* 16.9*  NEUTROABS 11.6*  --   HGB 14.9 11.3*  HCT 49.1 35.0*  MCV 91.1  85.6  PLT 298 258   Basic Metabolic Panel: Recent Labs  Lab 06/19/18 1843 06/20/18 0258 06/21/18 0232  NA 140  --  140  K 3.2*  --  4.0  CL 106  --  110  CO2 18*  --  24  GLUCOSE 184*  --  134*  BUN 5*  --  8  CREATININE 0.97  --  0.78  CALCIUM 9.1  --  8.9  MG  --   1.8  --    GFR: Estimated Creatinine Clearance: 126.2 mL/min (by C-G formula based on SCr of 0.78 mg/dL). Liver Function Tests: Recent Labs  Lab 06/19/18 1843  AST 24  ALT 12  ALKPHOS 59  BILITOT 0.7  PROT 7.4  ALBUMIN 4.6   No results for input(s): LIPASE, AMYLASE in the last 168 hours. No results for input(s): AMMONIA in the last 168 hours. Coagulation Profile: No results for input(s): INR, PROTIME in the last 168 hours. Cardiac Enzymes: Recent Labs  Lab 06/19/18 1843  TROPONINI <0.03   BNP (last 3 results) No results for input(s): PROBNP in the last 8760 hours. HbA1C: No results for input(s): HGBA1C in the last 72 hours. CBG: No results for input(s): GLUCAP in the last 168 hours. Lipid Profile: No results for input(s): CHOL, HDL, LDLCALC, TRIG, CHOLHDL, LDLDIRECT in the last 72 hours. Thyroid Function Tests: No results for input(s): TSH, T4TOTAL, FREET4, T3FREE, THYROIDAB in the last 72 hours. Anemia Panel: No results for input(s): VITAMINB12, FOLATE, FERRITIN, TIBC, IRON, RETICCTPCT in the last 72 hours. Urine analysis: No results found for: COLORURINE, APPEARANCEUR, LABSPEC, PHURINE, GLUCOSEU, HGBUR, BILIRUBINUR, KETONESUR, PROTEINUR, UROBILINOGEN, NITRITE, LEUKOCYTESUR Sepsis Labs: Invalid input(s): PROCALCITONIN, LACTICIDVEN  Recent Results (from the past 240 hour(s))  Culture, blood (x 2)     Status: None (Preliminary result)   Collection Time: 06/19/18 10:15 PM  Result Value Ref Range Status   Specimen Description BLOOD LEFT ARM  Final   Special Requests   Final    BOTTLES DRAWN AEROBIC AND ANAEROBIC Blood Culture results may not be optimal due to an excessive volume of blood received in culture bottles   Culture   Final    NO GROWTH 3 DAYS Performed at [facility] [address] [place], [place] [address]    Report Status PENDING  Incomplete  Respiratory Panel by PCR     Status: None   Collection Time: 06/19/18 10:26 PM  Result Value Ref  Range Status   Adenovirus NOT DETECTED NOT DETECTED Final   Coronavirus 229E NOT DETECTED NOT DETECTED Final   Coronavirus HKU1 NOT DETECTED NOT DETECTED Final   Coronavirus NL63 NOT DETECTED NOT DETECTED Final   Coronavirus OC43 NOT DETECTED NOT DETECTED Final   Metapneumovirus NOT DETECTED NOT DETECTED Final   Rhinovirus / Enterovirus NOT DETECTED NOT DETECTED Final   Influenza A NOT DETECTED NOT DETECTED Final   Influenza B NOT DETECTED NOT DETECTED Final   Parainfluenza Virus 1 NOT DETECTED NOT DETECTED Final   Parainfluenza Virus 2 NOT DETECTED NOT DETECTED Final   Parainfluenza Virus 3 NOT DETECTED NOT DETECTED Final   Parainfluenza Virus 4 NOT DETECTED NOT DETECTED Final   Respiratory Syncytial Virus NOT DETECTED NOT DETECTED Final   Bordetella pertussis NOT DETECTED NOT DETECTED Final   Chlamydophila pneumoniae NOT DETECTED NOT DETECTED Final   Mycoplasma pneumoniae NOT DETECTED NOT DETECTED Final    Comment: Performed at [facility], [place] [address], [place], [place] [address]  Group A Strep by PCR     Status: Abnormal   Collection Time: 06/19/18 10:26 PM  Result Value Ref Range Status   Group A Strep by PCR DETECTED (A) NOT DETECTED Final    Comment: Performed at [facility], [address] [place], [place] [address]  Culture, blood (x 2)     Status: None (Preliminary result)   Collection Time: 06/19/18 10:40 PM  Result Value Ref Range Status   Specimen Description BLOOD LEFT HAND  Final   Special Requests   Final    BOTTLES DRAWN AEROBIC ONLY Blood Culture adequate volume   Culture   Final    NO GROWTH 3 DAYS Performed at [facility], [place] [address] [place], [place] [address]    Report Status PENDING  Incomplete  Culture, sputum-assessment     Status: None   Collection Time: 06/20/18 10:32 AM  Result Value Ref Range Status   Specimen Description SPUTUM  Final   Special Requests NONE  Final   Sputum evaluation   Final    Sputum specimen not  acceptable for testing.  Please recollect.   Gram Stain Report Called to,Read Back By and Verified With: RN M TURNER 06/20/18 1200 MLM Performed at [facility] [address] [place], [place] [address]    Report Status 06/20/2018 FINAL  Final      Radiology Studies: No results found.  Marzetta Board, MD, PhD [facility] Pager [phone]  If 7PM-7AM, please contact night-coverage [email or web address] Password [email or web address] 06/22/2018, 10:40 AM

## 2018-06-23 ENCOUNTER — Inpatient Hospital Stay (HOSPITAL_COMMUNITY): Payer: Self-pay | Admitting: Certified Registered Nurse Anesthetist

## 2018-06-23 ENCOUNTER — Encounter (HOSPITAL_COMMUNITY): Payer: Self-pay | Admitting: *Deleted

## 2018-06-23 ENCOUNTER — Encounter (HOSPITAL_COMMUNITY)
Admission: EM | Disposition: A | Discharge status: Home / Self Care | Payer: Self-pay | Source: Home / Self Care | Attending: Internal Medicine

## 2018-06-23 ENCOUNTER — Inpatient Hospital Stay (HOSPITAL_COMMUNITY): Payer: Self-pay

## 2018-06-23 DIAGNOSIS — J9600 Acute respiratory failure, unspecified whether with hypoxia or hypercapnia: Secondary | ICD-10-CM

## 2018-06-23 DIAGNOSIS — I352 Nonrheumatic aortic (valve) stenosis with insufficiency: Secondary | ICD-10-CM

## 2018-06-23 DIAGNOSIS — I359 Nonrheumatic aortic valve disorder, unspecified: Secondary | ICD-10-CM

## 2018-06-23 HISTORY — PX: TEE WITHOUT CARDIOVERSION: SHX5443

## 2018-06-23 SURGERY — ECHOCARDIOGRAM, TRANSESOPHAGEAL
Anesthesia: Monitor Anesthesia Care

## 2018-06-23 SURGERY — Surgical Case
Anesthesia: *Unknown

## 2018-06-23 MED ORDER — PENICILLIN V POTASSIUM 500 MG PO TABS: 500.0000 mg | ORAL_TABLET | Freq: Two times a day (BID) | ORAL | 0 refills | Status: AC

## 2018-06-23 MED ORDER — PROPOFOL 10 MG/ML IV BOLUS
INTRAVENOUS | Status: DC | PRN
  Administered 2018-06-23 (×3): 30 mg via INTRAVENOUS
  Administered 2018-06-23: 40 mg via INTRAVENOUS

## 2018-06-23 MED ORDER — PROPOFOL 500 MG/50ML IV EMUL
INTRAVENOUS | Status: DC | PRN
  Administered 2018-06-23: 50 ug/kg/min via INTRAVENOUS

## 2018-06-23 MED ORDER — LACTATED RINGERS IV SOLN
INTRAVENOUS | Status: DC | PRN
  Administered 2018-06-23: 08:00:00 via INTRAVENOUS

## 2018-06-23 NOTE — Anesthesia Preprocedure Evaluation (Addendum)
Anesthesia Evaluation  Patient identified by MRN, date of birth, ID band Patient awake    Reviewed: Allergy & Precautions, NPO status , Patient's Chart, lab work & pertinent test results  Airway Mallampati: II  TM Distance: >3 FB Neck ROM: Full    Dental no notable dental hx.    Pulmonary Current Smoker,    Pulmonary exam normal breath sounds clear to auscultation       Cardiovascular hypertension, Normal cardiovascular exam Rhythm:Regular Rate:Normal     Neuro/Psych negative neurological ROS  negative psych ROS   GI/Hepatic negative GI ROS, Neg liver ROS, (+)     substance abuse  ,   Endo/Other  negative endocrine ROS  Renal/GU negative Renal ROS  negative genitourinary   Musculoskeletal negative musculoskeletal ROS (+)   Abdominal   Peds negative pediatric ROS (+)  Hematology negative hematology ROS (+)   Anesthesia Other Findings   Reproductive/Obstetrics negative OB ROS                            Anesthesia Physical Anesthesia Plan  ASA: III  Anesthesia Plan: MAC   Post-op Pain Management:    Induction: Intravenous  PONV Risk Score and Plan:   Airway Management Planned: Simple Face Mask  Additional Equipment:   Intra-op Plan:   Post-operative Plan:   Informed Consent: I have reviewed the patients History and Physical, chart, labs and discussed the procedure including the risks, benefits and alternatives for the proposed anesthesia with the patient or authorized representative who has indicated his/her understanding and acceptance.   Dental advisory given  Plan Discussed with: CRNA and Surgeon  Anesthesia Plan Comments:         Anesthesia Quick Evaluation

## 2018-06-23 NOTE — Op Note (Signed)
INDICATIONS: aortic valve mass  PROCEDURE:   Informed consent was obtained prior to the procedure. The risks, benefits and alternatives for the procedure were discussed and the patient comprehended these risks.  Risks include, but are not limited to, cough, sore throat, vomiting, nausea, somnolence, esophageal and stomach trauma or perforation, bleeding, low blood pressure, aspiration, pneumonia, infection, trauma to the teeth and death.    After a procedural time-out, the oropharynx was anesthetized with 20% benzocaine spray.   During this procedure the patient was administered IV propofol by Anesthesiology, Dr. Okey Dupreose.  The transesophageal probe was inserted in the esophagus and stomach without difficulty and multiple views were obtained.  The patient was kept under observation until the patient left the procedure room.  The patient left the procedure room in stable condition.   Agitated microbubble saline contrast was not administered.  COMPLICATIONS:    There were no immediate complications.  FINDINGS:  Bicuspid aortic valve with heavy calcification on the raphe between the left and right cusps. No distinct vegetation is identified. At least moderate aortic insufficiency. Moderate aortic stenosis. There is no evidence of aortic root dilation or coarctation. Otherwise normal study.  RECOMMENDATIONS:    Periodic follow up with TTE for aortic insufficiency.  Time Spent Directly with the Patient:  30 minutes   Jack Baldwin 06/23/2018, 8:50 AM

## 2018-06-23 NOTE — Interval H&P Note (Signed)
History and Physical Interval Note:  06/23/2018 8:15 AM  Jack Baldwin  has presented today for surgery, with the diagnosis of vegetation of aortic valve  The various methods of treatment have been discussed with the patient and family. After consideration of risks, benefits and other options for treatment, the patient has consented to  Procedure(s): TRANSESOPHAGEAL ECHOCARDIOGRAM (TEE) (N/A) as a surgical intervention .  The patient's history has been reviewed, patient examined, no change in status, stable for surgery.  I have reviewed the patient's chart and labs.  Questions were answered to the patient's satisfaction.     Jack Baldwin

## 2018-06-23 NOTE — Discharge Summary (Signed)
Physician Discharge Summary  Jack Baldwin QZE:[id number] DOB: 1981/07/01 DOA: 06/19/2018  PCP: Patient, No Pcp Per  Admit date: 06/19/2018 Discharge date: 06/23/2018  Admitted From: home Disposition:  home  Recommendations for Outpatient Follow-up:  1. Follow up with PCP in 1-2 weeks 2. Patient was advised to follow-up with cardiology and establish care for his aortic valve calcification  Home Health: none Equipment/Devices: none  Discharge Condition: stable CODE STATUS: Full code Diet recommendation: regular  HPI: Per Dr. Blaine Hamper, Jack Baldwin is a 37 y.o. male with medical history significant of tobacco abuse, who presents with shortness of breath. Patient states that he started having shortness of breath today, which has been progressively and rapidly worsening.  He has a dry cough, but had no chest pain, fever or chills.  He also reports sore throat.  Per ED physician, patient was initialy in severe respiratory distress, very diaphoretic, could not speak in full sentences, breathing is significantly labored. Patient was given Solu-Medrol, 2 duo nebs, 2 intramuscular epinephrines,2 g of magnesium,and supported with assist ventilations by EMS in route. Pt was given one dose of Ketamine and started BIPAP in ED. His respiratory distress has improved partially when I saw pt in ED, but still needs BiPAP.  Patient does not have nausea vomiting, diarrhea or abdominal pain pain no symptoms of UTI or unilateral weakness.  Patient has been smoking heavily since age of 89, at least one pack a day.  Hospital Course:  Principal problem Sepsis possible to COPD exacerbation and strep throat -Patient met criteria for sepsis with leukocytosis, tachycardia, tachypnea, sepsis is now resolved.  He was maintained on IV antibiotics, completed a steroid taper in the hospital, initially there were concern for endocarditis based on TTE however TEE was negative.  He was transitioned to amoxicillin for strep  throat and will complete a total of 10 days with the rest of the treatment as an outpatient. Additional problems Acute hypoxic respiratory failure -Resolved, now on room air AV calcification, murmur -Heavy calcification on 2D echo, recommended a TEE which did not show vegetation but showed bicuspid aortic valve with heavy calcification with moderate aortic insufficiency and stenosis with recommendations for outpatient cardiology follow-up.  This was discussed with the patient Hypokalemia -Resolved, K now normal Tobacco abuse -Counseled for cessation    Discharge Diagnoses:  Principal Problem:   Acute respiratory failure with hypoxia ([place]) Active Problems:   Sepsis ([place])   Hypokalemia   Tobacco abuse   Metabolic acidemia   Aortic valve disease     Discharge Instructions   Allergies as of 06/23/2018   No Known Allergies     Medication List    TAKE these medications   albuterol 108 (90 Base) MCG/ACT inhaler Commonly known as:  PROVENTIL HFA;VENTOLIN HFA Inhale 2 puffs into the lungs every 6 (six) hours as needed for wheezing or shortness of breath.   penicillin v potassium 500 MG tablet Commonly known as:  VEETID Take 1 tablet (500 mg total) by mouth 2 (two) times daily for 8 days.      Follow-up Information    Follow up with PCP. Schedule an appointment as soon as possible for a visit in 2 week(s).        [facility] Follow up on 07/10/2018.   Specialty:  Family Medicine Why:  9:10 for hospital follow up and to be established as new patient Contact information: [address] [place] [address] [phone]  [place] Follow up.   Why:  you can use the pharmacy here for discount medications Contact information: [place] [address] [phone]       [facility]. Schedule an appointment as soon as possible for a visit in 1  month(s).   Why:  make an appointment with a cardiologist in approximately 1-2 months to establish care Contact information: [address] [place] [phone]          Consultations:  None   Procedures/Studies:  2D echo  Study Conclusions - Left ventricle: The cavity size was normal. Systolic function was normal. The estimated ejection fraction was in the range of 60% to 65%. Wall motion was normal; there were no regional wall motion abnormalities. - Aortic valve: There is heavy calcification of the aortic annulus that appears to possibly involve the right and left coronary cusps but difficult to discern. Recommend TEE for further evaluation. Severely calcified annulus. Trileaflet; normal thickness leaflets. Valve area (VTI): 1.25 cm^2. Valve area (Vmax): 1.21 cm^2. Valve area (Vmean): 1.16 cm^2. - Mitral valve: There was mild regurgitation. - Tricuspid valve: There was mild regurgitation. - Pulmonic valve: There was trivial regurgitation. - Pulmonary arteries: PA peak pressure: 34 mm Hg (S). - Inferior vena cava: The vessel was mildly dilated. The respirophasic diameter changes were in the normal range (>= 50%).  Recommendations:  TEE for further evaluation of AV.   TEE FINDINGS:  Bicuspid aortic valve with heavy calcification on the raphe between the left and right cusps. No distinct vegetation is identified. At least moderate aortic insufficiency. Moderate aortic stenosis. There is no evidence of aortic root dilation or coarctation. Otherwise normal study.  Dg Chest Portable 1 View  Result Date: 06/19/2018 CLINICAL DATA:  Initial evaluation for acute shortness of breath. EXAM: PORTABLE CHEST 1 VIEW COMPARISON:  None. FINDINGS: Cardiac and mediastinal silhouettes are within normal limits. Lungs mildly hypoinflated. Attenuation of the pulmonary markings suggestive of underlying emphysema. No focal infiltrates. No edema or  effusion. No pneumothorax. No acute osseous abnormality. IMPRESSION: Emphysema.  No other active cardiopulmonary disease. Electronically Signed   By: Jeannine Boga M.D.   On: 06/19/2018 19:13      Subjective: - no chest pain, shortness of breath, no abdominal pain, nausea or vomiting.   Discharge Exam: Vitals:   06/23/18 0900 06/23/18 0910  BP: (!) 152/89 (!) 152/89  Pulse: (!) 56 61  Resp: (!) 22 20  Temp:    SpO2: 100% 100%    General: Pt is alert, awake, not in acute distress Cardiovascular: RRR, 3/6 SEM Respiratory: CTA bilaterally, no wheezing, no rhonchi Abdominal: Soft, NT, ND, bowel sounds + Extremities: no edema, no cyanosis    The results of significant diagnostics from this hospitalization (including imaging, microbiology, ancillary and laboratory) are listed below for reference.     Microbiology: Recent Results (from the past 240 hour(s))  Culture, blood (x 2)     Status: None (Preliminary result)   Collection Time: 06/19/18 10:15 PM  Result Value Ref Range Status   Specimen Description BLOOD LEFT ARM  Final   Special Requests   Final    BOTTLES DRAWN AEROBIC AND ANAEROBIC Blood Culture results may not be optimal due to an excessive volume of blood received in culture bottles   Culture   Final    NO GROWTH 4 DAYS Performed at [facility], [place] [address], [place], [place]  [address]    Report Status PENDING  Incomplete  Respiratory Panel by PCR     Status: None   Collection Time: 06/19/18 10:26 PM  Result Value Ref Range Status   Adenovirus NOT DETECTED NOT DETECTED Final   Coronavirus 229E NOT DETECTED NOT DETECTED Final   Coronavirus HKU1 NOT DETECTED NOT DETECTED Final   Coronavirus NL63 NOT DETECTED NOT DETECTED Final   Coronavirus OC43 NOT DETECTED NOT DETECTED Final   Metapneumovirus NOT DETECTED NOT DETECTED Final   Rhinovirus / Enterovirus NOT DETECTED NOT DETECTED Final   Influenza A NOT DETECTED NOT DETECTED Final   Influenza B  NOT DETECTED NOT DETECTED Final   Parainfluenza Virus 1 NOT DETECTED NOT DETECTED Final   Parainfluenza Virus 2 NOT DETECTED NOT DETECTED Final   Parainfluenza Virus 3 NOT DETECTED NOT DETECTED Final   Parainfluenza Virus 4 NOT DETECTED NOT DETECTED Final   Respiratory Syncytial Virus NOT DETECTED NOT DETECTED Final   Bordetella pertussis NOT DETECTED NOT DETECTED Final   Chlamydophila pneumoniae NOT DETECTED NOT DETECTED Final   Mycoplasma pneumoniae NOT DETECTED NOT DETECTED Final    Comment: Performed at [facility], [place] [address], [place], [place] [address]  Group A Strep by PCR     Status: Abnormal   Collection Time: 06/19/18 10:26 PM  Result Value Ref Range Status   Group A Strep by PCR DETECTED (A) NOT DETECTED Final    Comment: Performed at [facility], [place] [address], [place], [place] [address]  Culture, blood (x 2)     Status: None (Preliminary result)   Collection Time: 06/19/18 10:40 PM  Result Value Ref Range Status   Specimen Description BLOOD LEFT HAND  Final   Special Requests   Final    BOTTLES DRAWN AEROBIC ONLY Blood Culture adequate volume   Culture   Final    NO GROWTH 4 DAYS Performed at [facility], [place] [address] [place], [place] [address]    Report Status PENDING  Incomplete  Culture, sputum-assessment     Status: None   Collection Time: 06/20/18 10:32 AM  Result Value Ref Range Status   Specimen Description SPUTUM  Final   Special Requests NONE  Final   Sputum evaluation   Final    Sputum specimen not acceptable for testing.  Please recollect.   Gram Stain Report Called to,Read Back By and Verified With: RN M TURNER 06/20/18 1200 MLM Performed at [facility] [address], [place], [place] [address]    Report Status 06/20/2018 FINAL  Final     Labs: BNP (last 3 results) Recent Labs    06/19/18 1843  BNP 55.9   Basic Metabolic Panel: Recent Labs  Lab 06/19/18 1843 06/20/18 0258 06/21/18 0232  NA 140  --  140    K 3.2*  --  4.0  CL 106  --  110  CO2 18*  --  24  GLUCOSE 184*  --  134*  BUN 5*  --  8  CREATININE 0.97  --  0.78  CALCIUM 9.1  --  8.9  MG  --  1.8  --    Liver Function Tests: Recent Labs  Lab 06/19/18 1843  AST 24  ALT 12  ALKPHOS 59  BILITOT 0.7  PROT 7.4  ALBUMIN 4.6   No results for input(s): LIPASE, AMYLASE in the last 168 hours. No results for input(s): AMMONIA in the last 168 hours. CBC: Recent Labs  Lab 06/19/18 1843 06/21/18 0232  WBC 15.9* 16.9*  NEUTROABS 11.6*  --   HGB 14.9 11.3*  HCT 49.1 35.0*  MCV 91.1 85.6  PLT 298 243   Cardiac Enzymes: Recent Labs  Lab 06/19/18 1843  TROPONINI <0.03   BNP: Invalid input(s): POCBNP CBG: No results for input(s): GLUCAP in the last 168 hours. D-Dimer No results for input(s): DDIMER in the last 72 hours. Hgb A1c No results for input(s): HGBA1C in the last 72 hours. Lipid Profile No results for input(s): CHOL, HDL, LDLCALC, TRIG, CHOLHDL, LDLDIRECT in the last 72 hours. Thyroid function studies No results for input(s): TSH, T4TOTAL, T3FREE, THYROIDAB in the last 72 hours.  Invalid input(s): FREET3 Anemia work up No results for input(s): VITAMINB12, FOLATE, FERRITIN, TIBC, IRON, RETICCTPCT in the last 72 hours. Urinalysis No results found for: COLORURINE, APPEARANCEUR, LABSPEC, [place], GLUCOSEU, HGBUR, BILIRUBINUR, KETONESUR, PROTEINUR, UROBILINOGEN, NITRITE, LEUKOCYTESUR Sepsis Labs Invalid input(s): PROCALCITONIN,  WBC,  LACTICIDVEN   Time coordinating discharge: 25 minutes  SIGNED:  Marzetta Board, MD  [facility] 06/23/2018, 2:30 PM Pager [phone]  If 7PM-7AM, please contact night-coverage [email or web address] Password [email or web address]

## 2018-06-23 NOTE — Progress Notes (Signed)
Patient was stable at discharge. I removed their IV. We reviewed the discharge education. Patient/Family verbalized understanding and had no further questions. Patient left with prescription/s in hand.  

## 2018-06-23 NOTE — Progress Notes (Signed)
  Echocardiogram Echocardiogram Transesophageal has been performed.  Krystn Dermody L Androw 06/23/2018, 9:08 AM

## 2018-06-23 NOTE — Anesthesia Procedure Notes (Signed)
Date/Time: 06/23/2018 8:28 AM Performed by: Mayer CamelBarr, Payeton Germani S, CRNA Oxygen Delivery Method: Nasal cannula Placement Confirmation: positive ETCO2

## 2018-06-23 NOTE — Transfer of Care (Signed)
Immediate Anesthesia Transfer of Care Note  Patient: Jack Baldwin  Procedure(s) Performed: TRANSESOPHAGEAL ECHOCARDIOGRAM (TEE) (N/A )  Patient Location: PACU and Endoscopy Unit  Anesthesia Type:General  Level of Consciousness: awake and alert   Airway & Oxygen Therapy: Patient Spontanous Breathing and Patient connected to nasal cannula oxygen  Post-op Assessment: Report given to RN and Post -op Vital signs reviewed and stable  Post vital signs: Reviewed and stable  Last Vitals:  Vitals Value Taken Time  BP 93/65 06/23/2018  8:56 AM  Temp 36.6 C 06/23/2018  8:56 AM  Pulse 61 06/23/2018  8:59 AM  Resp 22 06/23/2018  8:59 AM  SpO2 100 % 06/23/2018  8:59 AM  Vitals shown include unvalidated device data.  Last Pain:  Vitals:   06/23/18 0856  TempSrc: Oral  PainSc:          Complications: No apparent anesthesia complications

## 2018-06-24 LAB — CULTURE, BLOOD (ROUTINE X 2)
CULTURE: NO GROWTH
Culture: NO GROWTH
SPECIAL REQUESTS: ADEQUATE

## 2018-06-25 ENCOUNTER — Encounter (HOSPITAL_COMMUNITY): Payer: Self-pay | Admitting: Cardiovascular Disease

## 2018-06-26 ENCOUNTER — Encounter (HOSPITAL_COMMUNITY): Payer: Self-pay | Admitting: Cardiovascular Disease

## 2018-06-26 NOTE — Anesthesia Postprocedure Evaluation (Signed)
Anesthesia Post Note  Patient: Jack Baldwin  Procedure(s) Performed: TRANSESOPHAGEAL ECHOCARDIOGRAM (TEE) (N/A )     Patient location during evaluation: PACU Anesthesia Type: MAC Level of consciousness: awake and alert Pain management: pain level controlled Vital Signs Assessment: post-procedure vital signs reviewed and stable Respiratory status: spontaneous breathing, nonlabored ventilation, respiratory function stable and patient connected to nasal cannula oxygen Cardiovascular status: stable and blood pressure returned to baseline Postop Assessment: no apparent nausea or vomiting Anesthetic complications: no    Last Vitals:  Vitals:   06/23/18 0900 06/23/18 0910  BP: (!) 152/89 (!) 152/89  Pulse: (!) 56 61  Resp: (!) 22 20  Temp:    SpO2: 100% 100%    Last Pain:  Vitals:   06/23/18 0910  TempSrc:   PainSc: 0-No pain                 Thaine Garriga S

## 2018-06-27 ENCOUNTER — Encounter (HOSPITAL_COMMUNITY): Payer: Self-pay | Admitting: Emergency Medicine

## 2018-07-10 ENCOUNTER — Inpatient Hospital Stay (INDEPENDENT_AMBULATORY_CARE_PROVIDER_SITE_OTHER): Payer: Self-pay | Admitting: Physician Assistant

## 2019-12-30 ENCOUNTER — Ambulatory Visit (HOSPITAL_COMMUNITY)
Admission: EM | Admit: 2019-12-30 | Discharge: 2019-12-30 | Disposition: A | Discharge status: Home / Self Care | Payer: Self-pay | Attending: Physician Assistant | Admitting: Physician Assistant

## 2019-12-30 ENCOUNTER — Encounter (HOSPITAL_COMMUNITY): Payer: Self-pay

## 2019-12-30 DIAGNOSIS — L0201 Cutaneous abscess of face: Secondary | ICD-10-CM

## 2019-12-30 HISTORY — DX: Unspecified asthma, uncomplicated: J45.909

## 2019-12-30 MED ORDER — SULFAMETHOXAZOLE-TRIMETHOPRIM 800-160 MG PO TABS: 1.0000 | ORAL_TABLET | Freq: Two times a day (BID) | ORAL | 0 refills | Status: DC

## 2019-12-30 MED ORDER — LIDOCAINE HCL 2 % IJ SOLN
INTRAMUSCULAR | Status: AC
  Filled 2019-12-30: qty 20

## 2019-12-30 NOTE — ED Triage Notes (Signed)
Pt c/o abscess under left side of chinx2wks. Pt has a large abscess under left side of chin.

## 2019-12-30 NOTE — Discharge Instructions (Signed)
Return if any problems.

## 2019-12-30 NOTE — ED Provider Notes (Signed)
[facility]    CSN: [id number] Arrival date & time: 12/30/19  1300      History   Chief Complaint Chief Complaint  Patient presents with  . Abscess    HPI Jack Baldwin. is a 39 y.o. male.   The history is provided by the patient. No language interpreter was used.  Abscess Location:  Face Facial abscess location:  Chin Size:  3 Abscess quality: redness and warmth   Progression:  Worsening Chronicity:  New Context: not diabetes   Relieved by:  Nothing Worsened by:  Nothing Ineffective treatments:  None tried Pt complains of a laceration of his chin   Past Medical History:  Diagnosis Date  . Asthma   . Smoking     Patient Active Problem List   Diagnosis Date Noted  . Aortic valve disease   . Metabolic acidemia 06/20/2018  . Sepsis ([facility]) 06/19/2018  . Hypokalemia 06/19/2018  . Acute respiratory failure with hypoxia ([facility]) 06/19/2018  . Tobacco abuse     Past Surgical History:  Procedure Laterality Date  . CYST REMOVAL NECK     cyst removal in throat per his sister  . TEE WITHOUT CARDIOVERSION N/A 06/23/2018   Procedure: TRANSESOPHAGEAL ECHOCARDIOGRAM (TEE);  Surgeon: Thurmon Fair, MD;  Location: [facility] ENDOSCOPY;  Service: Cardiovascular;  Laterality: N/A;       Home Medications    Prior to Admission medications   Medication Sig Start Date End Date Taking? Authorizing Provider  albuterol (PROVENTIL HFA;VENTOLIN HFA) 108 (90 BASE) MCG/ACT inhaler Inhale 1-2 puffs into the lungs every 6 (six) hours as needed for wheezing or shortness of breath (or cough spells). 12/17/11 12/16/12  Moreno-Coll, Adlih, MD  albuterol (PROVENTIL HFA;VENTOLIN HFA) 108 (90 Base) MCG/ACT inhaler Inhale 2 puffs into the lungs every 6 (six) hours as needed for wheezing or shortness of breath. 06/20/18   Jerald Kief, MD  sulfamethoxazole-trimethoprim (BACTRIM DS) 800-160 MG tablet Take 1 tablet by mouth 2 (two) times daily. 12/30/19   Elson Areas, PA-C     Family History Family History  Problem Relation Age of Onset  . Hypertension Mother   . Diabetes Mellitus II Father     Social History Social History   Tobacco Use  . Smoking status: Current Every Day Smoker    Packs/day: 0.50    Types: Cigarettes  . Smokeless tobacco: Never Used  Substance Use Topics  . Alcohol use: Yes    Comment: occ  . Drug use: Never     Allergies   Patient has no known allergies.   Review of Systems Review of Systems  All other systems reviewed and are negative.    Physical Exam Triage Vital Signs ED Triage Vitals [12/30/19 1350]  Enc Vitals Group     BP 125/75     Pulse Rate (!) 102     Resp 16     Temp 99 F (37.2 C)     Temp Source Oral     SpO2 100 %     Weight 150 lb (68 kg)     Height 6\' 3"  (1.905 m)     Head Circumference      Peak Flow      Pain Score 0     Pain Loc      Pain Edu?      Excl. in GC?    No data found.  Updated Vital Signs BP 125/75   Pulse (!) 102   Temp 99 F (  37.2 C) (Oral)   Resp 16   Ht 6\' 3"  (1.905 m)   Wt 68 kg   SpO2 100%   BMI 18.75 kg/m   Visual Acuity Right Eye Distance:   Left Eye Distance:   Bilateral Distance:    Right Eye Near:   Left Eye Near:    Bilateral Near:     Physical Exam Vitals reviewed.  Cardiovascular:     Rate and Rhythm: Normal rate.  Pulmonary:     Effort: Pulmonary effort is normal.  Skin:    General: Skin is warm.  Neurological:     General: No focal deficit present.     Mental Status: He is alert.  Psychiatric:        Mood and Affect: Mood normal.      UC Treatments / Results  Labs (all labs ordered are listed, but only abnormal results are displayed) Labs Reviewed - No data to display  EKG   Radiology No results found.  Procedures Incision and Drainage  Date/Time: 12/30/2019 2:35 PM Performed by: 02/29/2020, PA-C Authorized by: Elson Areas, PA-C   Consent:    Consent obtained:  Verbal   Consent given by:  Patient    Risks discussed:  Bleeding, incomplete drainage, pain and damage to other organs   Alternatives discussed:  No treatment Universal protocol:    Procedure explained and questions answered to patient or proxy's satisfaction: yes     Relevant documents present and verified: yes     Test results available and properly labeled: yes     Imaging studies available: yes     Required blood products, implants, devices, and special equipment available: yes     Site/side marked: yes     Immediately prior to procedure a time out was called: yes     Patient identity confirmed:  Verbally with patient Location:    Type:  Abscess   Size:  3    Location: chin. Pre-procedure details:    Skin preparation:  Betadine Anesthesia (see MAR for exact dosages):    Anesthesia method:  Local infiltration   Local anesthetic:  Lidocaine 1% w/o epi Procedure type:    Complexity:  Complex Procedure details:    Needle aspiration: no     Incision types:  Single straight   Incision depth:  Subcutaneous   Scalpel blade:  11   Wound management:  Probed and deloculated, irrigated with saline and extensive cleaning   Drainage:  Purulent   Drainage amount:  Moderate   Packing materials:  1/4 in gauze Post-procedure details:    Patient tolerance of procedure:  Tolerated well, no immediate complications   (including critical care time)  Medications Ordered in UC Medications - No data to display  Initial Impression / Assessment and Plan / UC Course  I have reviewed the triage vital signs and the nursing notes.  Pertinent labs & imaging results that were available during my care of the patient were reviewed by me and considered in my medical decision making (see chart for details).     MDM:  Pt counseled on follow up  Rx for bactrim  Final Clinical Impressions(s) / UC Diagnoses   Final diagnoses:  Abscess of chin     Discharge Instructions     Return if any problems.    ED Prescriptions    Medication  Sig Dispense Auth. Provider   sulfamethoxazole-trimethoprim (BACTRIM DS) 800-160 MG tablet Take 1 tablet by mouth 2 (two) times daily.  20 tablet Fransico Meadow, [place]     PDMP not reviewed this encounter.  An After Visit Summary was printed and given to the patient.   Fransico Meadow, [place] 12/30/19 1436

## 2021-08-05 ENCOUNTER — Ambulatory Visit (HOSPITAL_COMMUNITY)
Admission: EM | Admit: 2021-08-05 | Discharge: 2021-08-05 | Disposition: A | Discharge status: Home / Self Care | Payer: Self-pay | Attending: Family Medicine | Admitting: Family Medicine

## 2021-08-05 ENCOUNTER — Other Ambulatory Visit: Payer: Self-pay

## 2021-08-05 ENCOUNTER — Encounter (HOSPITAL_COMMUNITY): Payer: Self-pay

## 2021-08-05 DIAGNOSIS — K0889 Other specified disorders of teeth and supporting structures: Secondary | ICD-10-CM

## 2021-08-05 MED ORDER — IBUPROFEN 800 MG PO TABS: 800.0000 mg | ORAL_TABLET | Freq: Three times a day (TID) | ORAL | 0 refills | Status: AC

## 2021-08-05 MED ORDER — AMOXICILLIN 875 MG PO TABS: 875.0000 mg | ORAL_TABLET | Freq: Two times a day (BID) | ORAL | 0 refills | Status: AC

## 2021-08-05 NOTE — ED Provider Notes (Signed)
°  [facility]   [id number] 08/05/21 Arrival Time: 1517  ASSESSMENT & PLAN:  1. Pain, dental    No sign of abscess requiring I&D at this time. Discussed.  Meds ordered this encounter  Medications   ibuprofen (ADVIL) 800 MG tablet    Sig: Take 1 tablet (800 mg total) by mouth 3 (three) times daily with meals.    Dispense:  21 tablet    Refill:  0   amoxicillin (AMOXIL) 875 MG tablet    Sig: Take 1 tablet (875 mg total) by mouth 2 (two) times daily for 10 days.    Dispense:  20 tablet    Refill:  0   Dental resource written instructions given. He will schedule dental evaluation as soon as possible if not improving over the next 24-48 hours.  Reviewed expectations re: course of current medical issues. Questions answered. Outlined signs and symptoms indicating need for more acute intervention. Patient verbalized understanding. After Visit Summary given.   SUBJECTIVE:  Jack Baldwin. is a 41 y.o. male who reports gradual onset of right lower dental pain described as aching/throbbing. Present for 2 days. Fever: absent. Tolerating PO intake but reports pain with chewing. Normal swallowing. He does not see a dentist regularly. No neck swelling or pain. OTC analgesics without relief.  OBJECTIVE: Vitals:   08/05/21 1550  BP: (!) 154/94  Pulse: 96  Resp: 18  Temp: 98.7 F (37.1 C)  TempSrc: Oral  SpO2: 95%    General appearance: alert; no distress HENT: normocephalic; atraumatic; dentition: poor; right lower gum without areas of fluctuance, drainage, or bleeding and with tenderness to palpation; normal jaw movement without difficulty Neck: supple without LAD; FROM; trachea midline Lungs: normal respirations; unlabored; speaks full sentences without difficulty Skin: warm and dry Psychological: alert and cooperative; normal mood and affect  No Known Allergies  Past Medical History:  Diagnosis Date   Asthma    Smoking    Social History   Socioeconomic  History   Marital status: Married    Spouse name: Not on file   Number of children: Not on file   Years of education: Not on file   Highest education level: Not on file  Occupational History   Not on file  Tobacco Use   Smoking status: Every Day    Packs/day: 0.50    Types: Cigarettes   Smokeless tobacco: Never  Substance and Sexual Activity   Alcohol use: Yes    Comment: occ   Drug use: Never   Sexual activity: Not on file  Other Topics Concern   Not on file  Social History Narrative   ** Merged History Encounter **       Social Determinants of Health   Financial Resource Strain: Not on file  Food Insecurity: Not on file  Transportation Needs: Not on file  Physical Activity: Not on file  Stress: Not on file  Social Connections: Not on file  Intimate Partner Violence: Not on file   Family History  Problem Relation Age of Onset   Hypertension Mother    Diabetes Mellitus II Father    Past Surgical History:  Procedure Laterality Date   CYST REMOVAL NECK     cyst removal in throat per his sister   TEE WITHOUT CARDIOVERSION N/A 06/23/2018   Procedure: TRANSESOPHAGEAL ECHOCARDIOGRAM (TEE);  Surgeon: Thurmon Fair, MD;  Location: [facility] ENDOSCOPY;  Service: Cardiovascular;  Laterality: N/AMardella Layman, MD 08/05/21 1731

## 2021-08-05 NOTE — ED Triage Notes (Signed)
Pt c/o swelling and pain to rt lower gum/tooth since this am.

## 2022-06-09 ENCOUNTER — Emergency Department (HOSPITAL_COMMUNITY)
Admission: EM | Admit: 2022-06-09 | Discharge: 2022-06-09 | Disposition: A | Discharge status: Home / Self Care | Payer: Self-pay | Attending: Student | Admitting: Student

## 2022-06-09 ENCOUNTER — Other Ambulatory Visit: Payer: Self-pay

## 2022-06-09 DIAGNOSIS — H60501 Unspecified acute noninfective otitis externa, right ear: Secondary | ICD-10-CM

## 2022-06-09 DIAGNOSIS — H9201 Otalgia, right ear: Secondary | ICD-10-CM | POA: Insufficient documentation

## 2022-06-09 DIAGNOSIS — L0291 Cutaneous abscess, unspecified: Secondary | ICD-10-CM

## 2022-06-09 MED ORDER — CEPHALEXIN 500 MG PO CAPS: 500.0000 mg | ORAL_CAPSULE | Freq: Four times a day (QID) | ORAL | 0 refills | Status: AC

## 2022-06-09 MED ORDER — OFLOXACIN 0.3 % OT SOLN: 5.0000 [drp] | Freq: Two times a day (BID) | OTIC | 0 refills | Status: AC

## 2022-06-09 NOTE — ED Triage Notes (Signed)
Pt c/o right sided ear pain. Pt states he feels like its "completely blocked," having difficulty hearing, and has started draining. S/s ongoing for about 1 weeks.

## 2022-06-09 NOTE — Discharge Instructions (Signed)
You have an abscess in your right ear, I have given you antibiotics as well as drops please take as prescribed.  I recommend applying warm compresses to your ear do not place anything inside you.  You may clean out the external part of the ear with a Q-tip.  Please follow-up with ENT especially if symptoms not improving after 2 to 3 days of antibiotic use.  Come back to the emergency department if you develop chest pain, shortness of breath, severe abdominal pain, uncontrolled nausea, vomiting, diarrhea.

## 2022-06-09 NOTE — ED Provider Notes (Signed)
  [facility] EMERGENCY DEPARTMENT Provider Note   CSN: [id number] Arrival date & time: 06/09/22  2010     History {Add pertinent medical, surgical, social history, OB history to HPI:1} Chief Complaint  Patient presents with   Otalgia    Jack Carnell Ruddell Montez Hageman. is a 41 y.o. male.  HPI     Home Medications Prior to Admission medications   Medication Sig Start Date End Date Taking? Authorizing Provider  albuterol (PROVENTIL HFA;VENTOLIN HFA) 108 (90 BASE) MCG/ACT inhaler Inhale 1-2 puffs into the lungs every 6 (six) hours as needed for wheezing or shortness of breath (or cough spells). 12/17/11 12/16/12  Moreno-Coll, Adlih, MD  albuterol (PROVENTIL HFA;VENTOLIN HFA) 108 (90 Base) MCG/ACT inhaler Inhale 2 puffs into the lungs every 6 (six) hours as needed for wheezing or shortness of breath. 06/20/18   Jerald Kief, MD  ibuprofen (ADVIL) 800 MG tablet Take 1 tablet (800 mg total) by mouth 3 (three) times daily with meals. 08/05/21   Mardella Layman, MD      Allergies    Patient has no known allergies.    Review of Systems   Review of Systems  Physical Exam Updated Vital Signs BP (!) 137/90   Pulse 91   Temp 98 F (36.7 C) (Oral)   Resp 16   SpO2 98%  Physical Exam  ED Results / Procedures / Treatments   Labs (all labs ordered are listed, but only abnormal results are displayed) Labs Reviewed - No data to display  EKG None  Radiology No results found.  Procedures Procedures  {Document cardiac monitor, telemetry assessment procedure when appropriate:1}  Medications Ordered in ED Medications - No data to display  ED Course/ Medical Decision Making/ A&P                           Medical Decision Making  ***  {Document critical care time when appropriate:1} {Document review of labs and clinical decision tools ie heart score, Chads2Vasc2 etc:1}  {Document your independent review of radiology images, and any outside records:1} {Document  your discussion with family members, caretakers, and with consultants:1} {Document social determinants of health affecting pt's care:1} {Document your decision making why or why not admission, treatments were needed:1} Final Clinical Impression(s) / ED Diagnoses Final diagnoses:  None    Rx / DC Orders ED Discharge Orders     None
